# Patient Record
Sex: Female | Born: 1980 | Race: White | Hispanic: No | Smoking: Never smoker
Health system: Southern US, Community
[De-identification: ages and names within clinical notes are randomized; demographics above are authoritative.]

## PROBLEM LIST (undated history)

## (undated) ENCOUNTER — Emergency Department (HOSPITAL_COMMUNITY): Admission: EM | Payer: 59 | Source: Home / Self Care

## (undated) DIAGNOSIS — Z8742 Personal history of other diseases of the female genital tract: Secondary | ICD-10-CM

## (undated) DIAGNOSIS — F32A Depression, unspecified: Secondary | ICD-10-CM

## (undated) DIAGNOSIS — F329 Major depressive disorder, single episode, unspecified: Secondary | ICD-10-CM

## (undated) DIAGNOSIS — G43909 Migraine, unspecified, not intractable, without status migrainosus: Secondary | ICD-10-CM

## (undated) DIAGNOSIS — Z62810 Personal history of physical and sexual abuse in childhood: Secondary | ICD-10-CM

## (undated) DIAGNOSIS — M1990 Unspecified osteoarthritis, unspecified site: Secondary | ICD-10-CM

## (undated) DIAGNOSIS — E559 Vitamin D deficiency, unspecified: Secondary | ICD-10-CM

## (undated) DIAGNOSIS — R011 Cardiac murmur, unspecified: Secondary | ICD-10-CM

## (undated) HISTORY — DX: Vitamin D deficiency, unspecified: E55.9

## (undated) HISTORY — DX: Cardiac murmur, unspecified: R01.1

## (undated) HISTORY — PX: DILATATION & CURETTAGE/HYSTEROSCOPY WITH TRUECLEAR: SHX6353

## (undated) HISTORY — DX: Migraine, unspecified, not intractable, without status migrainosus: G43.909

## (undated) HISTORY — DX: Personal history of other diseases of the female genital tract: Z87.42

## (undated) HISTORY — PX: ABDOMINAL HYSTERECTOMY: SHX81

## (undated) HISTORY — DX: Personal history of physical and sexual abuse in childhood: Z62.810

---
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
## 2013-04-20 ENCOUNTER — Encounter: Payer: Self-pay | Admitting: Emergency Medicine

## 2013-04-20 ENCOUNTER — Emergency Department (INDEPENDENT_AMBULATORY_CARE_PROVIDER_SITE_OTHER)
Admission: EM | Admit: 2013-04-20 | Discharge: 2013-04-20 | Disposition: A | Discharge status: Home / Self Care | Payer: 59 | Source: Home / Self Care | Attending: Emergency Medicine | Admitting: Emergency Medicine

## 2013-04-20 DIAGNOSIS — J0100 Acute maxillary sinusitis, unspecified: Secondary | ICD-10-CM

## 2013-04-20 HISTORY — DX: Depression, unspecified: F32.A

## 2013-04-20 HISTORY — DX: Unspecified osteoarthritis, unspecified site: M19.90

## 2013-04-20 HISTORY — DX: Major depressive disorder, single episode, unspecified: F32.9

## 2013-04-20 MED ORDER — PROMETHAZINE-CODEINE 6.25-10 MG/5ML PO SYRP: ORAL_SOLUTION | ORAL | Status: DC

## 2013-04-20 MED ORDER — LEVOFLOXACIN 500 MG PO TABS: ORAL_TABLET | ORAL | Status: DC

## 2013-04-20 MED ORDER — PSEUDOEPHEDRINE-GUAIFENESIN ER 120-1200 MG PO TB12: 1.0000 | ORAL_TABLET | Freq: Two times a day (BID) | ORAL | Status: DC

## 2013-04-20 NOTE — ED Provider Notes (Signed)
CSN: [id number]     Arrival date & time 04/20/13  4098 History   First MD Initiated Contact with Patient 04/20/13 1008     Chief Complaint  Patient presents with  . Cough  . Nasal Congestion   (Consider location/radiation/quality/duration/timing/severity/associated sxs/prior Treatment) HPI URI HISTORY  Gwendolyn Bowen is a 32 y.o. female who complains of onset of cold symptoms for 7 days.  Have been using over-the-counter treatment which helps a little bit.--Symptoms much worse for 2 days  No chills/sweats +  Fever  +  Nasal congestion +  Discolored, yellowish-green Post-nasal drainage Positive sinus pain/pressure No sore throat  +  Cough, usually nonproductive. Keeps her awake at night No wheezing No chest congestion No hemoptysis No shortness of breath No pleuritic pain  No itchy/red eyes No earache  No nausea No vomiting No abdominal pain No diarrhea  No skin rashes +  Fatigue No myalgias Mild headache  Past Medical History  Diagnosis Date  . Depression   . Arthritis    Past Surgical History  Procedure Laterality Date  . Dilatation & curettage/hysteroscopy with trueclear     Family History  Problem Relation Age of Onset  . Hypertension Mother   . Rheum arthritis Mother   . Cancer Mother     breast  . Clotting disorder Mother   . Bell's palsy Mother    History  Substance Use Topics  . Smoking status: Never Smoker   . Smokeless tobacco: Not on file  . Alcohol Use: No   OB History   Grav Para Term Preterm Abortions TAB SAB Ect Mult Living                 Review of Systems  All other systems reviewed and are negative.    Allergies  Amoxicillin; Asa; Ibuprofen; Penicillins; and Sulfa antibiotics  Home Medications   Current Outpatient Rx  Name  Route  Sig  Dispense  Refill  . clonazePAM (KLONOPIN) 0.5 MG tablet   Oral   Take 0.5 mg by mouth 2 (two) times daily as needed for anxiety.         . sertraline (ZOLOFT) 100 MG tablet   Oral   Take  100 mg by mouth daily.         Marland Kitchen levofloxacin (LEVAQUIN) 500 MG tablet      Take 1 tablet daily for 10 days.   10 tablet   0   . promethazine-codeine (PHENERGAN WITH CODEINE) 6.25-10 MG/5ML syrup      Take 1-2 teaspoons every 4-6 hours as needed for cough. May cause drowsiness.   120 mL   0   . Pseudoephedrine-Guaifenesin [phone] MG TB12   Oral   Take 1 tablet by mouth 2 (two) times daily. As needed for congestion. (Caution: Do not take if blood pressure is elevated)   20 each   0    BP 102/64  Pulse 75  Temp(Src) 98.1 F (36.7 C) (Oral)  Resp 18  Wt 115 lb (52.164 kg)  SpO2 99%  LMP 03/30/2013 Physical Exam  Nursing note and vitals reviewed. Constitutional: She is oriented to person, place, and time. She appears well-developed and well-nourished. No distress.  HENT:  Head: Normocephalic and atraumatic.  Right Ear: Tympanic membrane, external ear and ear canal normal.  Left Ear: Tympanic membrane, external ear and ear canal normal.  Nose: Mucosal edema and rhinorrhea present. Right sinus exhibits maxillary sinus tenderness. Left sinus exhibits maxillary sinus tenderness.  Mouth/Throat: Oropharynx is clear and  moist. No oral lesions. No oropharyngeal exudate.  Eyes: Right eye exhibits no discharge. Left eye exhibits no discharge. No scleral icterus.  Neck: Neck supple.  Cardiovascular: Normal rate, regular rhythm and normal heart sounds.   Pulmonary/Chest: Effort normal and breath sounds normal. She has no wheezes. She has no rales.  Lymphadenopathy:    She has no cervical adenopathy.  Neurological: She is alert and oriented to person, place, and time.  Skin: Skin is warm and dry.    ED Course  Procedures (including critical care time) Labs Review Labs Reviewed - No data to display Imaging Review No results found.  EKG Interpretation     Ventricular Rate:    PR Interval:    QRS Duration:   QT Interval:    QTC Calculation:   R Axis:     Text  Interpretation:              MDM   1. Acute maxillary sinusitis    Discussed antibiotic treatment options at length. She is allergic to amoxicillin penicillin and sulfa. She states that Zithromax has not worked well in the past. She denies chance of pregnancy, as she is status post BTL, LMP 3 weeks ago. Prescribed Levaquin 500 mg daily x10 days. Pseudoephedrine-Guaifenesin [phone] MG twice a day for congestion Promethazine with codeine for cough, precautions discussed Other symptomatic care. Followup with PCP if no better one week, sooner when necessary Precautions discussed. Red flags discussed. Questions invited and answered. Patient voiced understanding and agreement.      Lajean Manes, MD 04/20/13 [phone]

## 2013-04-20 NOTE — ED Notes (Signed)
Pt c/o cough, chest and nasal congestion, and sneezing x 1wk. Denies fever.

## 2013-05-04 ENCOUNTER — Telehealth: Payer: Self-pay

## 2013-05-04 NOTE — Telephone Encounter (Signed)
error 

## 2013-09-30 ENCOUNTER — Ambulatory Visit: Payer: 59 | Admitting: Family Medicine

## 2013-10-07 ENCOUNTER — Ambulatory Visit: Payer: 59 | Admitting: Family Medicine

## 2013-10-07 DIAGNOSIS — Z0289 Encounter for other administrative examinations: Secondary | ICD-10-CM

## 2013-11-15 ENCOUNTER — Ambulatory Visit: Payer: 59 | Admitting: Family Medicine

## 2013-11-15 DIAGNOSIS — Z0289 Encounter for other administrative examinations: Secondary | ICD-10-CM

## 2014-01-25 ENCOUNTER — Ambulatory Visit (INDEPENDENT_AMBULATORY_CARE_PROVIDER_SITE_OTHER): Payer: 59 | Admitting: Family Medicine

## 2014-01-25 ENCOUNTER — Encounter: Payer: Self-pay | Admitting: Family Medicine

## 2014-01-25 VITALS — BP 121/73 | HR 60 | Ht 64.0 in | Wt 113.0 lb

## 2014-01-25 DIAGNOSIS — L309 Dermatitis, unspecified: Secondary | ICD-10-CM | POA: Insufficient documentation

## 2014-01-25 DIAGNOSIS — L259 Unspecified contact dermatitis, unspecified cause: Secondary | ICD-10-CM

## 2014-01-25 DIAGNOSIS — B9689 Other specified bacterial agents as the cause of diseases classified elsewhere: Secondary | ICD-10-CM

## 2014-01-25 DIAGNOSIS — IMO0002 Reserved for concepts with insufficient information to code with codable children: Secondary | ICD-10-CM | POA: Insufficient documentation

## 2014-01-25 DIAGNOSIS — J329 Chronic sinusitis, unspecified: Secondary | ICD-10-CM

## 2014-01-25 DIAGNOSIS — F32A Depression, unspecified: Secondary | ICD-10-CM

## 2014-01-25 DIAGNOSIS — G43909 Migraine, unspecified, not intractable, without status migrainosus: Secondary | ICD-10-CM

## 2014-01-25 DIAGNOSIS — F329 Major depressive disorder, single episode, unspecified: Secondary | ICD-10-CM | POA: Insufficient documentation

## 2014-01-25 DIAGNOSIS — G43009 Migraine without aura, not intractable, without status migrainosus: Secondary | ICD-10-CM

## 2014-01-25 DIAGNOSIS — A499 Bacterial infection, unspecified: Secondary | ICD-10-CM

## 2014-01-25 DIAGNOSIS — F419 Anxiety disorder, unspecified: Principal | ICD-10-CM

## 2014-01-25 DIAGNOSIS — G43709 Chronic migraine without aura, not intractable, without status migrainosus: Secondary | ICD-10-CM | POA: Insufficient documentation

## 2014-01-25 DIAGNOSIS — F341 Dysthymic disorder: Secondary | ICD-10-CM

## 2014-01-25 MED ORDER — SUMATRIPTAN SUCCINATE 6 MG/0.5ML ~~LOC~~ SOTJ: SUBCUTANEOUS | Status: DC

## 2014-01-25 MED ORDER — SERTRALINE HCL 100 MG PO TABS: 100.0000 mg | ORAL_TABLET | Freq: Every day | ORAL | Status: DC

## 2014-01-25 MED ORDER — LEVOFLOXACIN 500 MG PO TABS: 500.0000 mg | ORAL_TABLET | Freq: Every day | ORAL | Status: DC

## 2014-01-25 NOTE — Progress Notes (Signed)
CC: Gwendolyn Bowen is a 33 y.o. female is here for Establish Care   Subjective: HPI:  Pleasant 33 year old her to establish care works at [employer]  Patient reports a history of anxiety and depression which has been present ever since she was 18. She began taking Zoloft approximately 8 years ago due to what was thought to be worsening depression due to postpartum depression. States that Zoloft helped dramatically with her outlook on life and help with dealing with stress.  She reports anxiety has also been present intermittently in her past however also improved with Zoloft and sparingly used clonazepam. She stopped both medications somewhere between 3-6 months ago to see if she still needed to be on them. She states her quality of life is satisfactory but her husband and coworkers have noticed increasing nervousness and pessimism since she stopped these medications. She denies any thoughts or not resolve or others recently or remotely. She is entertaining the idea of restarting Zoloft  Reports a history of migraines have been present for matter of years. He used to be responsive to nonsteroidal anti-inflammatories however she's had serious reactions to multiple nonsteroidal anti-inflammatory medications over the past years. Describes migraines as without aura, pounding, unilateral, photophobia without any other motor or sensory disturbances present prior during or after the migraine. Medications like Maxalt and Imitrex have not provided any benefit however Sumavel DosePro injections provided almost immediate relief of her migraines. Denies change in the character severity or frequency of her migraines over the past year  She reports a history of chronic skin irritation from light touch, shaving it is of moderate severity and only barely responds to Benadryl. No benefit from Allegra, Zyrtec, or Claritin. 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 She never has seen an allergist.  She complains of left-sided facial pressure localized beneath the cheek along with nasal congestion and a postnasal drip that has been present for the past week worsening on a daily basis. No benefit from Benadryl or other over-the-counter antihistamines. Symptoms are moderate in severity.  Review of Systems - General ROS: negative for - chills, fever, night sweats, weight gain or weight loss Ophthalmic ROS: negative for - decreased vision Psychological ROS: negative for - mental disturbance other than that described above ENT ROS: negative for - hearing change, tinnitus  Hematological and Lymphatic ROS: negative for - bleeding problems, bruising or swollen lymph nodes Breast ROS: negative Respiratory ROS: no cough, shortness of breath, or wheezing Cardiovascular ROS: no chest pain or dyspnea on exertion Gastrointestinal ROS: no abdominal pain, change in bowel habits, or black or bloody stools Genito-Urinary ROS: negative for - genital discharge, genital ulcers, incontinence or abnormal bleeding from genitals Musculoskeletal ROS: negative for - joint pain or muscle pain Neurological ROS: negative for - memory loss Dermatological ROS: negative for lumps, mole changes, rash and skin lesion changes other than that described above  Past Medical History  Diagnosis Date  . Depression   . Arthritis     Past Surgical History  Procedure Laterality Date  . Dilatation & curettage/hysteroscopy with trueclear     Family History  Problem Relation Age of Onset  . Hypertension Mother   . Rheum arthritis Mother   . Cancer Mother     breast  . Clotting disorder Mother   . Bell's palsy Mother     History   Social History  . Marital Status: Married    Spouse Name: N/A  Number of Children: N/A  . Years of Education: N/A   Occupational History  . Not on file.   Social History Main Topics  . Smoking status: Never Smoker   . Smokeless  tobacco: Not on file  . Alcohol Use: No  . Drug Use: No  . Sexual Activity: Yes   Other Topics Concern  . Not on file   Social History Narrative  . No narrative on file     Objective: BP 121/73  Pulse 60  Ht 5\' 4"  (1.626 m)  Wt 113 lb (51.256 kg)  BMI 19.39 kg/m2  General: Alert and Oriented, No Acute Distress HEENT: Pupils equal, round, reactive to light. Conjunctivae clear.  External ears unremarkable, canals clear with intact TMs with appropriate landmarks.  Middle ear appears open without effusion. Pink inferior turbinates.  Moist mucous membranes, pharynx without inflammation nor lesions However moderate cobblestoning.  Neck supple without palpable lymphadenopathy nor abnormal masses. Lungs: Clear to auscultation bilaterally, no wheezing/ronchi/rales.  Comfortable work of breathing. Good air movement. Cardiac: Regular rate and rhythm.  Neuro: Cranial nerves 2-12 grossly intact  Extremities: No peripheral edema.  Strong peripheral pulses.  Mental Status: No depression, anxiety, nor agitation. Skin: Warm and dry.  Assessment & Plan: Gwendolyn Bowen was seen today for establish care.  Diagnoses and associated orders for this visit:  Anxiety and depression - sertraline (ZOLOFT) 100 MG tablet; Take 1 tablet (100 mg total) by mouth daily.  Migraine without aura and without status migrainosus, not intractable - SUMAtriptan Succinate (SUMAVEL DOSEPRO) 6 MG/0.5ML SOTJ; Inject one dose in the thigh or abdomen at onset of migraine, may repeat once in an hour if needed.  Max 2 doses/24hours.  Bacterial sinusitis - levofloxacin (LEVAQUIN) 500 MG tablet; Take 1 tablet (500 mg total) by mouth daily.  Chronic dermatitis - Ambulatory referral to Allergy    Anxiety and depression: Uncontrolled restarting Zoloft, taking only half a tablet for the first 1-2 weeks then full tablet as tolerated Migraines: Controlled on sumatriptan injectable Chronic dermatitis: Referring to allergy to determine  her candidacy for allergy shots and further investigation into nonsteroidal anti-inflammatory allergy Actual sinusitis: Start levofloxacin discussed benefits of nasal saline washes   Return in about 3 months (around 04/27/2014) for Complete Physical Exam.

## 2014-01-31 ENCOUNTER — Telehealth: Payer: Self-pay | Admitting: Family Medicine

## 2014-01-31 NOTE — Telephone Encounter (Signed)
Re: Referral for Chronic Dermatitis Dr [facility] wants to know if you would like patient to be seen for Dermatitis or allergies because their doctor refuses to see patients for Dermatitis.  Thank you.

## 2014-01-31 NOTE — Telephone Encounter (Signed)
Referral was for both dermatitis and allergies, if they refuse to see dermatitis patients then can you please find another Allergy specialist that would be willing to help?

## 2014-03-01 ENCOUNTER — Telehealth: Payer: Self-pay

## 2014-03-01 NOTE — Telephone Encounter (Signed)
Gwendolyn Bowen called and reports diarrhea for 3 weeks and black diarrhea for 1 week. She has been taking Pepto Bismol which is the probable cause of the black diarrhea. Denies vomiting, chills, fevers or sweats. She has had some nausea. I tried to schedule her for the 4 pm appointment with Dr Ivan Anchors. She states she is unable to leave work today or even tomorrow. I advised her to go to the Urgent Care next door. She states she will go to the Urgent Care after hours.

## 2014-03-01 NOTE — Telephone Encounter (Signed)
Agreed -

## 2014-03-09 ENCOUNTER — Ambulatory Visit (INDEPENDENT_AMBULATORY_CARE_PROVIDER_SITE_OTHER): Payer: 59 | Admitting: Family Medicine

## 2014-03-09 ENCOUNTER — Encounter: Payer: Self-pay | Admitting: Family Medicine

## 2014-03-09 ENCOUNTER — Other Ambulatory Visit: Payer: Self-pay | Admitting: Family Medicine

## 2014-03-09 VITALS — BP 104/66 | HR 84 | Ht 64.0 in | Wt 115.0 lb

## 2014-03-09 DIAGNOSIS — R197 Diarrhea, unspecified: Secondary | ICD-10-CM

## 2014-03-09 DIAGNOSIS — G4700 Insomnia, unspecified: Secondary | ICD-10-CM

## 2014-03-09 LAB — COMPREHENSIVE METABOLIC PANEL
ALT: 19 U/L (ref 0–35)
AST: 16 U/L (ref 0–37)
Albumin: 4.1 g/dL (ref 3.5–5.2)
Alkaline Phosphatase: 39 U/L (ref 39–117)
BUN: 13 mg/dL (ref 6–23)
CO2: 26 mEq/L (ref 19–32)
CREATININE: 0.71 mg/dL (ref 0.50–1.10)
Calcium: 8.7 mg/dL (ref 8.4–10.5)
Chloride: 105 mEq/L (ref 96–112)
Glucose, Bld: 52 mg/dL — ABNORMAL LOW (ref 70–99)
POTASSIUM: 4.1 meq/L (ref 3.5–5.3)
Sodium: 140 mEq/L (ref 135–145)
Total Bilirubin: 0.6 mg/dL (ref 0.2–1.2)
Total Protein: 6.3 g/dL (ref 6.0–8.3)

## 2014-03-09 LAB — CBC WITH DIFFERENTIAL/PLATELET
BASOS ABS: 0 10*3/uL (ref 0.0–0.1)
Basophils Relative: 0 % (ref 0–1)
Eosinophils Absolute: 0 10*3/uL (ref 0.0–0.7)
Eosinophils Relative: 0 % (ref 0–5)
HEMATOCRIT: 37.8 % (ref 36.0–46.0)
Hemoglobin: 12.8 g/dL (ref 12.0–15.0)
LYMPHS PCT: 29 % (ref 12–46)
Lymphs Abs: 1.5 10*3/uL (ref 0.7–4.0)
MCH: 28.9 pg (ref 26.0–34.0)
MCHC: 33.9 g/dL (ref 30.0–36.0)
MCV: 85.3 fL (ref 78.0–100.0)
Monocytes Absolute: 0.4 10*3/uL (ref 0.1–1.0)
Monocytes Relative: 7 % (ref 3–12)
NEUTROS ABS: 3.4 10*3/uL (ref 1.7–7.7)
Neutrophils Relative %: 64 % (ref 43–77)
PLATELETS: 179 10*3/uL (ref 150–400)
RBC: 4.43 MIL/uL (ref 3.87–5.11)
RDW: 13.9 % (ref 11.5–15.5)
WBC: 5.3 10*3/uL (ref 4.0–10.5)

## 2014-03-09 MED ORDER — ZOLPIDEM TARTRATE 10 MG PO TABS: 5.0000 mg | ORAL_TABLET | Freq: Every evening | ORAL | Status: DC | PRN

## 2014-03-09 NOTE — Progress Notes (Signed)
CC: Gwendolyn Bowen is a 33 y.o. female is here for Constipation   Subjective: HPI:  Complains of loose bowel movements that occur 2-5 times a day for the past month and a half. They described as liquid and loose, they will come on without warning she has had multiple accidents and she has indicated her sleep unknowingly. Symptoms are moderate to severe in severity slightly improved with Pepto-Bismol but not resolved. Nothing else seems to make better or worse. No linked to dietary habits. Accompanied with mild bloating but no abdominal or flank pain. Denies nausea, vomiting, fevers, chills, nor genitourinary complaints. Denies blood in stool nor melena. 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 Interventions have included trying to get a bed at different times of the night but no benefit. She denies anxiety or depression contributing to these symptoms. She denies pain contributing to these symptoms. She's never taken anything to help with this in the past. Denies any mental disturbance.   Review Of Systems Outlined In HPI  Past Medical History  Diagnosis Date  . Depression   . Arthritis     Past Surgical History  Procedure Laterality Date  . Dilatation & curettage/hysteroscopy with trueclear     Family History  Problem Relation Age of Onset  . Hypertension Mother   . Rheum arthritis Mother   . Cancer Mother     breast  . Clotting disorder Mother   . Bell's palsy Mother     History   Social History  . Marital Status: Married    Spouse Name: N/A    Number of Children: N/A  . Years of Education: N/A   Occupational History  . Not on file.   Social History Main Topics  . Smoking status: Never Smoker   . Smokeless tobacco: Not on file  . Alcohol Use: No  . Drug Use: No  . Sexual  Activity: Yes   Other Topics Concern  . Not on file   Social History Narrative  . No narrative on file     Objective: BP 104/66  Pulse 84  Ht 5' 4" (1.626 m)  Wt 115 lb (52.164 kg)  BMI 19.73 kg/m2  General: Alert and Oriented, No Acute Distress HEENT: Pupils equal, round, reactive to light. Conjunctivae clear.  Moist membranes Lungs: Clear to auscultation bilaterally, no wheezing/ronchi/rales.  Comfortable work of breathing. Good air movement. Cardiac: Regular rate and rhythm. Normal S1/S2.  No murmurs, rubs, nor gallops.   Abdomen: Flat and soft no abnormal bowel sounds Extremities: No peripheral edema.  Strong peripheral pulses.  Mental Status: No depression, anxiety, nor agitation. Skin: Warm and dry.  Assessment & Plan: Shakeria was seen today for constipation.  Diagnoses and associated orders for this visit:  Diarrhea - CBC w/Diff - Clostridium difficile EIA - Comp Met (CMET)  Insomnia - zolpidem (AMBIEN) 10 MG tablet; Take 0.5-1 tablets (5-10 mg total) by mouth at bedtime as needed for sleep.    Diarrhea: Concerning for possible C. Difficile, checking C. difficile and white count. Checking metabolic panel. Avoid antidiarrheal medication pending the above results. Insomnia: Trial of Ambien however I've asked her to wait to start this until we get her diarrhea under control.  Return if symptoms worsen or fail to improve.

## 2014-03-14 ENCOUNTER — Encounter: Payer: Self-pay | Admitting: *Deleted

## 2014-03-15 ENCOUNTER — Telehealth: Payer: Self-pay | Admitting: Family Medicine

## 2014-03-15 LAB — C. DIFFICILE GDH AND TOXIN A/B
C. difficile GDH: NOT DETECTED
C. difficile Toxin A/B: NOT DETECTED

## 2014-03-15 MED ORDER — DIPHENOXYLATE-ATROPINE 2.5-0.025 MG PO TABS: 1.0000 | ORAL_TABLET | Freq: Three times a day (TID) | ORAL | Status: DC | PRN

## 2014-03-15 NOTE — Telephone Encounter (Signed)
Sue Lush, Will you please let patient know that her c. Diff test was negative showing no sign of infection.  To control her diarrhea I'd recommend taking 1-2 heaping teaspoons of psyllium powder daily that can be obtained OTC.  Additionally I'll print out an Rx of lomotil which can be used PRN for any residual diarrhea.

## 2014-03-15 NOTE — Telephone Encounter (Signed)
Left message on vm to return call and I also faxed script to pharmacy. This was also left on vm. Waiting on return call. Corliss Skains, CMA

## 2014-03-15 NOTE — Telephone Encounter (Signed)
Patient advised of results and recommendations.  

## 2014-05-12 ENCOUNTER — Other Ambulatory Visit: Payer: Self-pay | Admitting: Family Medicine

## 2014-05-25 ENCOUNTER — Other Ambulatory Visit: Payer: Self-pay | Admitting: Occupational Medicine

## 2014-05-25 ENCOUNTER — Ambulatory Visit: Payer: Self-pay

## 2014-05-25 DIAGNOSIS — R52 Pain, unspecified: Secondary | ICD-10-CM

## 2014-06-19 ENCOUNTER — Ambulatory Visit (INDEPENDENT_AMBULATORY_CARE_PROVIDER_SITE_OTHER): Payer: 59 | Admitting: Family Medicine

## 2014-06-19 ENCOUNTER — Encounter: Payer: Self-pay | Admitting: Family Medicine

## 2014-06-19 ENCOUNTER — Telehealth: Payer: Self-pay

## 2014-06-19 VITALS — BP 100/61 | HR 63 | Temp 98.6°F | Wt 120.0 lb

## 2014-06-19 DIAGNOSIS — R519 Headache, unspecified: Secondary | ICD-10-CM

## 2014-06-19 DIAGNOSIS — A499 Bacterial infection, unspecified: Secondary | ICD-10-CM

## 2014-06-19 DIAGNOSIS — B9689 Other specified bacterial agents as the cause of diseases classified elsewhere: Secondary | ICD-10-CM

## 2014-06-19 DIAGNOSIS — G4700 Insomnia, unspecified: Secondary | ICD-10-CM

## 2014-06-19 DIAGNOSIS — R51 Headache: Secondary | ICD-10-CM

## 2014-06-19 DIAGNOSIS — R110 Nausea: Secondary | ICD-10-CM

## 2014-06-19 DIAGNOSIS — J329 Chronic sinusitis, unspecified: Secondary | ICD-10-CM

## 2014-06-19 MED ORDER — ZOLPIDEM TARTRATE ER 12.5 MG PO TBCR: 12.5000 mg | EXTENDED_RELEASE_TABLET | Freq: Every evening | ORAL | Status: DC | PRN

## 2014-06-19 MED ORDER — DOXYCYCLINE HYCLATE 100 MG PO TABS
ORAL_TABLET | ORAL | Status: AC
Start: 2014-06-19 — End: 2014-06-29

## 2014-06-19 NOTE — Progress Notes (Signed)
CC: Gwendolyn Bowen is a 33 y.o. female is here for Medication Management and Cough   Subjective: HPI:  Cough with sore throat that has been present for 1 week worsening on a daily basis now moderate in severity. Symptoms seem to be worse when reclining back. Seem to be worse at nighttime. Interventions have included cough drops without much benefit. No other interventions as of yet. Accompanied by fatigue and night sweats. Denies fevers, chills, wheezing, shortness of breath, chest pain, , nor nasal congestion.  Complaints of chronic nausea that has been present for matter of years. Symptoms are mild to moderate in severity but she is now wondering if this needs further workup or intervention. She denies vomiting, abdominal pain, unintentional weight loss or gain. Nothing particular seems to make the nausea better or worse.  Follow-up insomnia: She's been taking a full dose of Ambien on a nightly basis. Over the past month most nights out of the week she wakes up around 1 or 2 in the morning for no particular reason. She has difficulty falling back asleep. She denies any difficulty with falling asleep initially when she goes to bed provided she takes Ambien. Denies any new mental disturbance, denies pain awakening her from her sleep  She would like to know if I can fill out FMLA paperwork for her headaches. She's had to call out on average 1-2 times a year due to her severe migraines. She denies any change in the character or severity or frequency of these headaches. Other than headaches denies any motor or sensory disturbances   Review Of Systems Outlined In HPI  Past Medical History  Diagnosis Date  . Depression   . Arthritis     Past Surgical History  Procedure Laterality Date  . Dilatation & curettage/hysteroscopy with trueclear     Family History  Problem Relation Age of Onset  . Hypertension Mother   . Rheum arthritis Mother   . Cancer Mother     breast  . Clotting disorder Mother    . Bell's palsy Mother     History   Social History  . Marital Status: Married    Spouse Name: N/A    Number of Children: N/A  . Years of Education: N/A   Occupational History  . Not on file.   Social History Main Topics  . Smoking status: Never Smoker   . Smokeless tobacco: Not on file  . Alcohol Use: No  . Drug Use: No  . Sexual Activity: Yes   Other Topics Concern  . Not on file   Social History Narrative     Objective: BP 100/61 mmHg  Pulse 63  Temp(Src) 98.6 F (37 C) (Oral)  Wt 120 lb (54.432 kg)  LMP 04/27/2014  General: Alert and Oriented, No Acute Distress HEENT: Pupils equal, round, reactive to light. Conjunctivae clear.  External ears unremarkable, canals clear with intact TMs with appropriate landmarks.  Middle ear appears open without effusion. Pink inferior turbinates.  Moist mucous membranes, pharynx without inflammation nor lesions.  Neck supple without palpable lymphadenopathy nor abnormal masses. Lungs: Clear to auscultation bilaterally, no wheezing/ronchi/rales.  Comfortable work of breathing. Good air movement. Extremities: No peripheral edema.  Strong peripheral pulses.  Mental Status: No depression, anxiety, nor agitation. Requires frequent redirection Skin: Warm and dry.  Assessment & Plan: Gwendolyn Bowen was seen today for medication management and cough.  Diagnoses and associated orders for this visit:  Nausea without vomiting - Lipase - Comp Met (CMET)  Bacterial sinusitis -  doxycycline (VIBRA-TABS) 100 MG tablet; One by mouth twice a day for ten days.  Insomnia - zolpidem (AMBIEN CR) 12.5 MG CR tablet; Take 1 tablet (12.5 mg total) by mouth at bedtime as needed for sleep.  Chronic nonintractable headache, unspecified headache type    Nausea: Checking lipase and metabolic panel, ultimate recommendations will be determined based on these results Bacterial sinusitis with postnasal drip causing cough, start doxycycline consider nasal saline  washes Insomnia: Uncontrolled stop immediate release Ambien and switching to long-acting formulation Headaches: Controlled, She does not have FMLA paperwork with her today but I told her I would be happy to help fill this out if she drops off in the future.  40 minutes spent face-to-face during visit today of which at least 50% was counseling or coordinating care regarding: 1. Nausea without vomiting   2. Bacterial sinusitis   3. Insomnia   4. Chronic nonintractable headache, unspecified headache type      Return if symptoms worsen or fail to improve.

## 2014-06-19 NOTE — Telephone Encounter (Signed)
[name] would like medication for her nausea. She would prefer Zofran. Please advise.

## 2014-06-20 MED ORDER — ONDANSETRON HCL 4 MG PO TABS: 4.0000 mg | ORAL_TABLET | Freq: Three times a day (TID) | ORAL | Status: DC | PRN

## 2014-06-20 NOTE — Telephone Encounter (Signed)
Rx sent to [facility].  

## 2014-06-21 LAB — COMPREHENSIVE METABOLIC PANEL
ALK PHOS: 41 U/L (ref 39–117)
ALT: 14 U/L (ref 0–35)
AST: 15 U/L (ref 0–37)
Albumin: 4.3 g/dL (ref 3.5–5.2)
BILIRUBIN TOTAL: 0.6 mg/dL (ref 0.2–1.2)
BUN: 12 mg/dL (ref 6–23)
CO2: 32 mEq/L (ref 19–32)
CREATININE: 0.94 mg/dL (ref 0.50–1.10)
Calcium: 9.3 mg/dL (ref 8.4–10.5)
Chloride: 103 mEq/L (ref 96–112)
Glucose, Bld: 116 mg/dL — ABNORMAL HIGH (ref 70–99)
Potassium: 3.7 mEq/L (ref 3.5–5.3)
Sodium: 141 mEq/L (ref 135–145)
TOTAL PROTEIN: 6.7 g/dL (ref 6.0–8.3)

## 2014-06-21 LAB — LIPASE: Lipase: 16 U/L (ref 0–75)

## 2014-07-18 ENCOUNTER — Ambulatory Visit (INDEPENDENT_AMBULATORY_CARE_PROVIDER_SITE_OTHER): Payer: 59 | Admitting: Family Medicine

## 2014-07-18 ENCOUNTER — Encounter: Payer: Self-pay | Admitting: Family Medicine

## 2014-07-18 VITALS — BP 105/67 | HR 78 | Wt 120.0 lb

## 2014-07-18 DIAGNOSIS — J329 Chronic sinusitis, unspecified: Secondary | ICD-10-CM

## 2014-07-18 DIAGNOSIS — B9689 Other specified bacterial agents as the cause of diseases classified elsewhere: Secondary | ICD-10-CM

## 2014-07-18 DIAGNOSIS — A499 Bacterial infection, unspecified: Secondary | ICD-10-CM

## 2014-07-18 MED ORDER — LEVOFLOXACIN 500 MG PO TABS: 500.0000 mg | ORAL_TABLET | Freq: Every day | ORAL | Status: DC

## 2014-07-18 NOTE — Progress Notes (Signed)
CC: Gwendolyn Bowen is a 34 y.o. female is here for Sinusitis   Subjective: HPI:  Facial pressure localized to the forehead with nasal congestion and nonproductive cough. Accompanied by chills and subjective fever. This is been present for a little over a week now worsening over the past 2 days. Overall moderate in severity. No benefit from Mucinex or over-the-counter cough and cold medication. Nothing seems to make symptoms better or worse that she is aware of. They're present all hours of the day not interfering with sleep. Denies shortness of breath, cough, dysphagia, confusion, nor motor or sensory disturbances   Review Of Systems Outlined In HPI  Past Medical History  Diagnosis Date  . Depression   . Arthritis     Past Surgical History  Procedure Laterality Date  . Dilatation & curettage/hysteroscopy with trueclear     Family History  Problem Relation Age of Onset  . Hypertension Mother   . Rheum arthritis Mother   . Cancer Mother     breast  . Clotting disorder Mother   . Bell's palsy Mother     History   Social History  . Marital Status: Married    Spouse Name: N/A    Number of Children: N/A  . Years of Education: N/A   Occupational History  . Not on file.   Social History Main Topics  . Smoking status: Never Smoker   . Smokeless tobacco: Not on file  . Alcohol Use: No  . Drug Use: No  . Sexual Activity: Yes   Other Topics Concern  . Not on file   Social History Narrative     Objective: BP 105/67 mmHg  Pulse 78  Wt 120 lb (54.432 kg)  General: Alert and Oriented, No Acute Distress HEENT: Pupils equal, round, reactive to light. Conjunctivae clear.  External ears unremarkable, canals clear with intact TMs with appropriate landmarks.  Middle ear appears open without effusion. Pink inferior turbinates.  Moist mucous membranes, pharynx without inflammation other than postnasal drip and mild cobblestoning.  Neck supple without palpable lymphadenopathy nor  abnormal masses. Lungs: Clear to auscultation bilaterally, no wheezing/ronchi/rales.  Comfortable work of breathing. Good air movement.  Mental Status: No depression, anxiety, nor agitation. Skin: Warm and dry.  Assessment & Plan: Gwendolyn Bowen was seen today for sinusitis.  Diagnoses and associated orders for this visit:  Bacterial sinusitis - levofloxacin (LEVAQUIN) 500 MG tablet; Take 1 tablet (500 mg total) by mouth daily.    Bacterial sinusitis: Allergic to penicillins, was on doxycycline within the last month, will start levofloxacin. Encouraged to start nasal saline washes.  Return if symptoms worsen or fail to improve.

## 2014-10-19 ENCOUNTER — Encounter: Payer: Self-pay | Admitting: Family Medicine

## 2014-10-19 ENCOUNTER — Ambulatory Visit (INDEPENDENT_AMBULATORY_CARE_PROVIDER_SITE_OTHER): Payer: 59 | Admitting: Family Medicine

## 2014-10-19 VITALS — BP 99/64 | HR 71 | Wt 118.0 lb

## 2014-10-19 DIAGNOSIS — F329 Major depressive disorder, single episode, unspecified: Secondary | ICD-10-CM

## 2014-10-19 DIAGNOSIS — F418 Other specified anxiety disorders: Secondary | ICD-10-CM | POA: Diagnosis not present

## 2014-10-19 DIAGNOSIS — G4700 Insomnia, unspecified: Secondary | ICD-10-CM | POA: Diagnosis not present

## 2014-10-19 DIAGNOSIS — F419 Anxiety disorder, unspecified: Principal | ICD-10-CM

## 2014-10-19 DIAGNOSIS — F32A Depression, unspecified: Secondary | ICD-10-CM

## 2014-10-19 MED ORDER — ZOLPIDEM TARTRATE ER 12.5 MG PO TBCR: 12.5000 mg | EXTENDED_RELEASE_TABLET | Freq: Every evening | ORAL | Status: DC | PRN

## 2014-10-19 MED ORDER — VENLAFAXINE HCL ER 75 MG PO CP24: ORAL_CAPSULE | ORAL | Status: DC

## 2014-10-19 NOTE — Progress Notes (Signed)
CC: Gwendolyn Bowen is a 34 y.o. female is here for feels celexa is not working for her   Subjective: HPI:  Follow-up anxiety and depression: She tells me she doesn't particularly feel depressed lately but her anxiety and irritability seems to be worsening for reasons unknown to her. She describes this as a short tolerance and irritability towards annoying stimuli, job responsibilities, and family responsibilities. Symptoms are moderate in severity present on a daily basis and at work and at home. It seems to make the symptoms better. Zoloft used to work fantastic for this however lost its effectiveness and when switched to Celexa she feels like it never really helped. She's been waiting months now for it to provide some degree of relief. She tried to double the dose however caused her to feel sedated to a degree that she could not tolerate. Denies thoughts when to harm herself or others. No sleep disturbance ever since she started taking as needed Ambien. She's requesting refills on Ambien does help with both falling asleep and staying asleep without any sedation the next morning. No known side effects to either medication other than that above   Review Of Systems Outlined In HPI  Past Medical History  Diagnosis Date  . Depression   . Arthritis     Past Surgical History  Procedure Laterality Date  . Dilatation & curettage/hysteroscopy with trueclear     Family History  Problem Relation Age of Onset  . Hypertension Mother   . Rheum arthritis Mother   . Cancer Mother     breast  . Clotting disorder Mother   . Bell's palsy Mother     History   Social History  . Marital Status: Married    Spouse Name: N/A  . Number of Children: N/A  . Years of Education: N/A   Occupational History  . Not on file.   Social History Main Topics  . Smoking status: Never Smoker   . Smokeless tobacco: Not on file  . Alcohol Use: No  . Drug Use: No  . Sexual Activity: Yes   Other Topics Concern   . Not on file   Social History Narrative     Objective: BP 99/64 mmHg  Pulse 71  Wt 118 lb (53.524 kg)  Vital signs reviewed. General: Alert and Oriented, No Acute Distress HEENT: Pupils equal, round, reactive to light. Conjunctivae clear.  External ears unremarkable.  Moist mucous membranes. Lungs: Clear and comfortable work of breathing, speaking in full sentences without accessory muscle use. Cardiac: Regular rate and rhythm.  Neuro: CN II-XII grossly intact, gait normal. Extremities: No peripheral edema.  Strong peripheral pulses.  Mental Status: No depression, anxiety, nor agitation. Logical though process. Skin: Warm and dry.  Assessment & Plan: Gwendolyn RoundsSally was seen today for feels celexa is not working for her.  Diagnoses and all orders for this visit:  Anxiety and depression Orders: -     venlafaxine XR (EFFEXOR XR) 75 MG 24 hr capsule; One by mouth daily for one week then two by mouth every day if tolerated.  Insomnia Orders: -     zolpidem (AMBIEN CR) 12.5 MG CR tablet; Take 1 tablet (12.5 mg total) by mouth at bedtime as needed for sleep.   Anxiety and depression: Depression seems controlled however anxiety has reached a point where it's significantly interfere with quality of life therefore start Effexor after stopping Celexa tomorrow. Insomnia: Control with as needed Ambien  25 minutes spent face-to-face during visit today of which  at least 50% was counseling or coordinating care regarding: 1. Anxiety and depression   2. Insomnia      Return in about 3 months (around 01/18/2015).

## 2014-10-26 ENCOUNTER — Telehealth: Payer: Self-pay | Admitting: Family Medicine

## 2014-10-26 NOTE — Telephone Encounter (Signed)
Sue Lushndrea, Will you please ask patient which condition/diagnosis she would like me to submit her FMLA under and any recent missed days due to this condition?

## 2014-10-26 NOTE — Telephone Encounter (Signed)
Called pt. Left message on vm Gwendolyn Bowen Gwendolyn Bowen SMA

## 2014-10-31 NOTE — Telephone Encounter (Signed)
She reports the diagnosis is Migraines.

## 2015-01-05 ENCOUNTER — Encounter: Payer: Self-pay | Admitting: Family Medicine

## 2015-01-05 ENCOUNTER — Ambulatory Visit (INDEPENDENT_AMBULATORY_CARE_PROVIDER_SITE_OTHER): Payer: 59 | Admitting: Family Medicine

## 2015-01-05 VITALS — BP 104/66 | HR 97 | Temp 98.6°F | Wt 124.0 lb

## 2015-01-05 DIAGNOSIS — H9203 Otalgia, bilateral: Secondary | ICD-10-CM

## 2015-01-05 DIAGNOSIS — F419 Anxiety disorder, unspecified: Principal | ICD-10-CM

## 2015-01-05 DIAGNOSIS — F418 Other specified anxiety disorders: Secondary | ICD-10-CM | POA: Diagnosis not present

## 2015-01-05 DIAGNOSIS — G43009 Migraine without aura, not intractable, without status migrainosus: Secondary | ICD-10-CM

## 2015-01-05 DIAGNOSIS — F329 Major depressive disorder, single episode, unspecified: Secondary | ICD-10-CM

## 2015-01-05 DIAGNOSIS — M722 Plantar fascial fibromatosis: Secondary | ICD-10-CM

## 2015-01-05 MED ORDER — DOXYCYCLINE HYCLATE 100 MG PO TABS: ORAL_TABLET | ORAL | Status: AC

## 2015-01-05 MED ORDER — SUMATRIPTAN SUCCINATE 6 MG/0.5ML ~~LOC~~ SOTJ
SUBCUTANEOUS | Status: DC
Start: 2015-01-05 — End: 2015-03-08

## 2015-01-05 MED ORDER — VENLAFAXINE HCL ER 75 MG PO CP24: ORAL_CAPSULE | ORAL | Status: DC

## 2015-01-05 NOTE — Progress Notes (Signed)
CC: Gwendolyn Bowen is a 34 y.o. female is here for Ear Pain and f/u migraines   Subjective: HPI:   Follow-up anxiety and depression: She tells me that Effexor is working great for both anxiety and depression. She denies any element of either in her life at this time.  Complains of migraines that have been occurring on a daily basis for the last week. Severity frequency and character has not changed since starting Effexor. Usually sumatriptan helps however she's run out of this medication. She's tried ibuprofen to help with this but doesn't seem to be working. Symptoms are currently mild in severity  Complains of bilateral ear pain described as a fullness and clicking sensation without any hearing loss. She's had some pressure in the forehead and cheeks but no nasal congestion or sore throat. No fevers, chills nor any other motor or sensory disturbances. No interventions as of yet and symptoms have been present for a week  Complains of a sharp stretching sensation that is moderate in severity only present at rest when not weightbearing that is localized on the plantar surface of both feet. Seems to be running from the heel towards the toes. It's absent when she is up walking around. His present for a few months and seems to be slowly getting worse. No intervention as of yet   Review Of Systems Outlined In HPI  Past Medical History  Diagnosis Date  . Depression   . Arthritis     Past Surgical History  Procedure Laterality Date  . Dilatation & curettage/hysteroscopy with trueclear     Family History  Problem Relation Age of Onset  . Hypertension Mother   . Rheum arthritis Mother   . Cancer Mother     breast  . Clotting disorder Mother   . Bell's palsy Mother     History   Social History  . Marital Status: Married    Spouse Name: N/A  . Number of Children: N/A  . Years of Education: N/A   Occupational History  . Not on file.   Social History Main Topics  . Smoking status:  Never Smoker   . Smokeless tobacco: Not on file  . Alcohol Use: No  . Drug Use: No  . Sexual Activity: Yes   Other Topics Concern  . Not on file   Social History Narrative     Objective: BP 104/66 mmHg  Pulse 97  Temp(Src) 98.6 F (37 C) (Oral)  Wt 124 lb (56.246 kg)  General: Alert and Oriented, No Acute Distress HEENT: Pupils equal, round, reactive to light. Conjunctivae clear.  External ears unremarkable, canals clear with intact TMs with appropriate landmarks.  Middle ear appears open without effusion. Pink inferior turbinates.  Moist mucous membranes, pharynx without inflammation nor lesions.  Neck supple without palpable lymphadenopathy nor abnormal masses. Lungs: Clear to auscultation bilaterally, no wheezing/ronchi/rales.  Comfortable work of breathing. Good air movement. Cardiac: Regular rate and rhythm. Normal S1/S2.  No murmurs, rubs, nor gallops.   Neuro: Cranial nerves II through XII grossly intact Extremities: No peripheral edema.  Strong peripheral pulses.  Mental Status: No depression, anxiety, nor agitation. Skin: Warm and dry.  Assessment & Plan: Gwendolyn RoundsSally was seen today for ear pain and f/u migraines.  Diagnoses and all orders for this visit:  Anxiety and depression Orders: -     venlafaxine XR (EFFEXOR XR) 75 MG 24 hr capsule; Two by mouth every day if tolerated.  Migraine without aura and without status migrainosus, not intractable  Orders: -     SUMAtriptan Succinate (SUMAVEL DOSEPRO) 6 MG/0.5ML SOTJ; Inject one dose in the thigh or abdomen at onset of migraine, may repeat once in an hour if needed.  Max 2 doses/24hours.  Plantar fasciitis  Ear pain, bilateral Orders: -     doxycycline (VIBRA-TABS) 100 MG tablet; One by mouth twice a day for ten days.   Anxiety depression: Controlled on Effexor Migraines: Uncontrolled only because she is out of sumatriptan, refills were provided, if she still having migraines on Monday please call me and we'll likely  start Topamax Plantar fasciitis: Start home rehabilitation exercises that she was provided with today, advised that she'll need to do this on a daily basis for at least 2-4 weeks before seeing improvement Ear pain felt to be secondary to sinusitis and eustachian tube dysfunction therefore start doxycycline, she cannot tolerate prednisone.   Return if symptoms worsen or fail to improve.

## 2015-01-18 ENCOUNTER — Other Ambulatory Visit: Payer: Self-pay | Admitting: Family Medicine

## 2015-02-26 ENCOUNTER — Emergency Department (HOSPITAL_COMMUNITY)
Admission: EM | Admit: 2015-02-26 | Discharge: 2015-02-26 | Disposition: A | Discharge status: Home / Self Care | Payer: 59 | Source: Home / Self Care | Attending: Family Medicine | Admitting: Family Medicine

## 2015-02-26 ENCOUNTER — Encounter (HOSPITAL_COMMUNITY): Payer: Self-pay | Admitting: Emergency Medicine

## 2015-02-26 DIAGNOSIS — J300 Vasomotor rhinitis: Secondary | ICD-10-CM | POA: Diagnosis not present

## 2015-02-26 MED ORDER — METHYLPREDNISOLONE ACETATE 40 MG/ML IJ SUSP
80.0000 mg | Freq: Once | INTRAMUSCULAR | Status: AC
  Administered 2015-02-26: 80 mg via INTRAMUSCULAR

## 2015-02-26 MED ORDER — IPRATROPIUM BROMIDE 0.06 % NA SOLN: 2.0000 | Freq: Four times a day (QID) | NASAL | Status: DC

## 2015-02-26 MED ORDER — TRIAMCINOLONE ACETONIDE 40 MG/ML IJ SUSP
40.0000 mg | Freq: Once | INTRAMUSCULAR | Status: AC
  Administered 2015-02-26: 40 mg via INTRAMUSCULAR

## 2015-02-26 MED ORDER — METHYLPREDNISOLONE ACETATE 80 MG/ML IJ SUSP
INTRAMUSCULAR | Status: AC
  Filled 2015-02-26: qty 1

## 2015-02-26 MED ORDER — MECLIZINE HCL 50 MG PO TABS: 50.0000 mg | ORAL_TABLET | Freq: Three times a day (TID) | ORAL | Status: DC | PRN

## 2015-02-26 MED ORDER — TRIAMCINOLONE ACETONIDE 40 MG/ML IJ SUSP
INTRAMUSCULAR | Status: AC
  Filled 2015-02-26: qty 1

## 2015-02-26 NOTE — ED Notes (Signed)
Discharge is pending medication administration

## 2015-02-26 NOTE — ED Notes (Signed)
C/o dizziness for one week, c/o headache.  Intermittent nausea

## 2015-02-26 NOTE — ED Provider Notes (Signed)
CSN: [id number]     Arrival date & time 02/26/15  1537 History   First MD Initiated Contact with Patient 02/26/15 1627     No chief complaint on file.  (Consider location/radiation/quality/duration/timing/severity/associated sxs/prior Treatment) Patient is a 34 y.o. female presenting with dizziness. The history is provided by the patient.  Dizziness Quality:  Lightheadedness Severity:  Mild Onset quality:  Gradual Duration:  1 week Progression:  Unchanged Chronicity:  Chronic Context: bending over   Context: not with loss of consciousness   Relieved by:  None tried Worsened by:  Nothing Ineffective treatments:  None tried Associated symptoms: headaches   Associated symptoms: no hearing loss, no palpitations, no shortness of breath, no syncope, no tinnitus, no vision changes and no vomiting     Past Medical History  Diagnosis Date  . Depression   . Arthritis    Past Surgical History  Procedure Laterality Date  . Dilatation & curettage/hysteroscopy with trueclear     Family History  Problem Relation Age of Onset  . Hypertension Mother   . Rheum arthritis Mother   . Cancer Mother     breast  . Clotting disorder Mother   . Bell's palsy Mother    Social History  Substance Use Topics  . Smoking status: Never Smoker   . Smokeless tobacco: Not on file  . Alcohol Use: No   OB History    No data available     Review of Systems  Constitutional: Negative.   HENT: Positive for congestion. Negative for hearing loss and tinnitus.   Respiratory: Negative.  Negative for shortness of breath.   Cardiovascular: Negative.  Negative for palpitations and syncope.  Gastrointestinal: Negative for vomiting.  Neurological: Positive for dizziness and headaches. Negative for syncope, speech difficulty, light-headedness and numbness.  All other systems reviewed and are negative.   Allergies  Codeine; Nsaids; Aleve; Amoxicillin; Asa; Ibuprofen; Penicillins; and Sulfa antibiotics  Home  Medications   Prior to Admission medications   Medication Sig Start Date End Date Taking? Authorizing Provider  clonazePAM (KLONOPIN) 0.5 MG tablet Take 0.5 mg by mouth 2 (two) times daily as needed for anxiety.    Historical Provider, MD  diphenoxylate-atropine (LOMOTIL) 2.5-0.025 MG per tablet Take 1 tablet by mouth 3 (three) times daily as needed for diarrhea or loose stools. 03/15/14   Sean Hommel, DO  EPINEPHrine (EPIPEN) 0.3 mg/0.3 mL IJ SOAJ injection Inject into the muscle once. 01/25/14   Sean Hommel, DO  ipratropium (ATROVENT) 0.06 % nasal spray Place 2 sprays into both nostrils 4 (four) times daily. 02/26/15   Linna Hoff, MD  meclizine (ANTIVERT) 50 MG tablet Take 1 tablet (50 mg total) by mouth 3 (three) times daily as needed. For dizziness 02/26/15   Linna Hoff, MD  ondansetron (ZOFRAN) 4 MG tablet Take 1-2 tablets (4-8 mg total) by mouth every 8 (eight) hours as needed for nausea or vomiting. 06/20/14   Laren Boom, DO  SUMAtriptan Succinate (SUMAVEL DOSEPRO) 6 MG/0.5ML SOTJ Inject one dose in the thigh or abdomen at onset of migraine, may repeat once in an hour if needed.  Max 2 doses/24hours. 01/05/15   Sean Hommel, DO  venlafaxine XR (EFFEXOR XR) 75 MG 24 hr capsule Two by mouth every day if tolerated. 01/05/15   Sean Hommel, DO  zolpidem (AMBIEN CR) 12.5 MG CR tablet TAKE 1 TABLET BY MOUTH AT BEDTIME AS NEEDED FOR SLEEP 01/22/15   Laren Boom, DO   Meds Ordered and Administered this Visit  Medications  triamcinolone acetonide (KENALOG-40) injection 40 mg (not administered)  methylPREDNISolone acetate (DEPO-MEDROL) injection 80 mg (not administered)    BP 111/69 mmHg  Pulse 77  Temp(Src) 98 F (36.7 C) (Oral)  Resp 16  SpO2 98% No data found.   Physical Exam  Constitutional: She is oriented to person, place, and time. She appears well-developed and well-nourished. No distress.  HENT:  Right Ear: External ear normal.  Left Ear: External ear normal.  Mouth/Throat:  Oropharynx is clear and moist.  Eyes: Conjunctivae and EOM are normal. Pupils are equal, round, and reactive to light.  Neck: Normal range of motion. Neck supple.  Cardiovascular: Normal rate and normal heart sounds.   Pulmonary/Chest: Effort normal and breath sounds normal.  Lymphadenopathy:    She has no cervical adenopathy.  Neurological: She is alert and oriented to person, place, and time. No cranial nerve deficit. Coordination normal.  Skin: Skin is warm and dry.  Nursing note and vitals reviewed.   ED Course  Procedures (including critical care time)  Labs Review Labs Reviewed - No data to display  Imaging Review No results found.   Visual Acuity Review  Right Eye Distance:   Left Eye Distance:   Bilateral Distance:    Right Eye Near:   Left Eye Near:    Bilateral Near:         MDM   1. Nonallergic vasomotor rhinitis     rx for atrovent and meclizine    Linna Hoff, MD 02/26/15 [id number]

## 2015-02-26 NOTE — Discharge Instructions (Signed)
Drink plenty of fluids, use medicine as prescribed, see your doctor if further problems. °

## 2015-03-08 ENCOUNTER — Ambulatory Visit (INDEPENDENT_AMBULATORY_CARE_PROVIDER_SITE_OTHER): Payer: 59 | Admitting: Family Medicine

## 2015-03-08 ENCOUNTER — Encounter: Payer: Self-pay | Admitting: Family Medicine

## 2015-03-08 VITALS — BP 108/69 | HR 75 | Wt 126.0 lb

## 2015-03-08 DIAGNOSIS — F509 Eating disorder, unspecified: Secondary | ICD-10-CM | POA: Diagnosis not present

## 2015-03-08 LAB — CBC
HCT: 37.8 % (ref 36.0–46.0)
HEMOGLOBIN: 12.6 g/dL (ref 12.0–15.0)
MCH: 27 pg (ref 26.0–34.0)
MCHC: 33.3 g/dL (ref 30.0–36.0)
MCV: 81.1 fL (ref 78.0–100.0)
MPV: 9.8 fL (ref 8.6–12.4)
PLATELETS: 172 10*3/uL (ref 150–400)
RBC: 4.66 MIL/uL (ref 3.87–5.11)
RDW: 14.2 % (ref 11.5–15.5)
WBC: 4.1 10*3/uL (ref 4.0–10.5)

## 2015-03-08 LAB — COMPLETE METABOLIC PANEL WITH GFR
ALBUMIN: 4.1 g/dL (ref 3.6–5.1)
ALT: 14 U/L (ref 6–29)
AST: 15 U/L (ref 10–30)
Alkaline Phosphatase: 48 U/L (ref 33–115)
BUN: 14 mg/dL (ref 7–25)
CHLORIDE: 104 mmol/L (ref 98–110)
CO2: 29 mmol/L (ref 20–31)
Calcium: 9.2 mg/dL (ref 8.6–10.2)
Creat: 0.66 mg/dL (ref 0.50–1.10)
GFR, Est African American: >89 mL/min (ref >=60)
GLUCOSE: 63 mg/dL — AB (ref 65–99)
POTASSIUM: 4.1 mmol/L (ref 3.5–5.3)
SODIUM: 142 mmol/L (ref 135–146)
Total Bilirubin: 0.3 mg/dL (ref 0.2–1.2)
Total Protein: 6.5 g/dL (ref 6.1–8.1)

## 2015-03-08 MED ORDER — DULOXETINE HCL 20 MG PO CPEP: 20.0000 mg | ORAL_CAPSULE | Freq: Every day | ORAL | Status: DC

## 2015-03-08 NOTE — Progress Notes (Signed)
CC: Gwendolyn Bowen is a 34 y.o. female is here for discuss wt gain   Subjective: HPI:  Needs fmla changed to twice a month.  Reports dissatisfaction with body image. She believes that she is overweight and has been dealing with this since she was a teenager but over the last few months symptoms have gotten much worse. She tells me she works out 3 times a day, consumes no more than 800 cal a day but still gains weight. She tells me it is not uncommon for her to force herself to vomit if she is feeling fat. She is not certain whether symptoms got better or worse after starting Effexor. She tells me her stomach feels upset, she feels fatigue, and just plan to feel sick. There has been no fever, cough, shortness of breath or wheezing. She has not experienced any diarrhea or constipation. She has been seeing a therapist however the therapist has refused to continue to see them as a couple unless the patient gets help for her eating disorder. She admits to long-standing anxiety and depression. Zoloft and Lexapro did not help any of the symptoms surrounding her eating disorder in the past. No thoughts when harm self or others.  She's noticed less that she eats the worse her headaches are. She is having to take Imitrex 3-4 times a month, missing work about 2 times a month due to headaches. Denies change in the character or severity of headaches. Changes have only been noticed with respect to frequency.   Review Of Systems Outlined In HPI  Past Medical History  Diagnosis Date  . Depression   . Arthritis     Past Surgical History  Procedure Laterality Date  . Dilatation & curettage/hysteroscopy with trueclear     Family History  Problem Relation Age of Onset  . Hypertension Mother   . Rheum arthritis Mother   . Cancer Mother     breast  . Clotting disorder Mother   . Bell's palsy Mother     Social History   Social History  . Marital Status: Married    Spouse Name: N/A  . Number of  Children: N/A  . Years of Education: N/A   Occupational History  . Not on file.   Social History Main Topics  . Smoking status: Never Smoker   . Smokeless tobacco: Not on file  . Alcohol Use: No  . Drug Use: No  . Sexual Activity: Yes   Other Topics Concern  . Not on file   Social History Narrative     Objective: BP 108/69 mmHg  Pulse 75  Wt 126 lb (57.153 kg)  LMP 04/27/2014  Vital signs reviewed. General: Alert and Oriented, No Acute Distress HEENT: Pupils equal, round, reactive to light. Conjunctivae clear.  External ears unremarkable.  Moist mucous membranes. Lungs: Clear and comfortable work of breathing, speaking in full sentences without accessory muscle use. Cardiac: Regular rate and rhythm.  Neuro: CN II-XII grossly intact, gait normal. Extremities: No peripheral edema.  Strong peripheral pulses.  Mental Status: No depression, nor agitation. Mildly anxious. Logical though process. Skin: Warm and dry.  Assessment & Plan: Gwendolyn Bowen was seen today for discuss wt gain.  Diagnoses and all orders for this visit:  Eating disorder -     Ambulatory referral to diabetic education -     DG Bone Density -     CBC -     COMPLETE METABOLIC PANEL WITH GFR -     Ambulatory referral to  Psychiatry -     DULoxetine (CYMBALTA) 20 MG capsule; Take 1 capsule (20 mg total) by mouth daily.  Eating disorder: Adding Cymbalta to Effexor. Checking bone density and metabolic panel to look for any dangerous changes to physiology. Checking a CBC to rule out anemia. Referral to both nutrition and psychiatry for further management.Signs and symptoms requring emergent/urgent reevaluation were discussed with the patient. An EKG was obtained and fortunately was reassuring showing a normal sinus rhythm, normal axis, no pathologic Q waves, and no ST segment elevation or depression. No signs of ventricular hypertrophy.  Return in about 4 weeks (around 04/05/2015).

## 2015-03-08 NOTE — Patient Instructions (Signed)
1. Get labs done downstairs I will call you about results tomorrow 2. After getting labs go to the radiology office and asked to get scheduled for your bone density scan 3. After scheduling your bone density scan go to the psychiatry office downstairs and asked to be scheduled to establish care. 4. Cymbalta was sent to your pharmacy you can take this along with Effexor until you see psychiatry. 5. If you have not been contacted by the nutritionist by next week please let me know so I can find out what's taking so long. 6. In order to change FMLA you need to contact human resources to send a new FMLA paperwork.

## 2015-03-13 ENCOUNTER — Ambulatory Visit (HOSPITAL_BASED_OUTPATIENT_CLINIC_OR_DEPARTMENT_OTHER)
Admission: RE | Admit: 2015-03-13 | Discharge: 2015-03-13 | Disposition: A | Discharge status: Home / Self Care | Payer: 59 | Source: Ambulatory Visit | Attending: Family Medicine | Admitting: Family Medicine

## 2015-03-13 DIAGNOSIS — Z8262 Family history of osteoporosis: Secondary | ICD-10-CM | POA: Insufficient documentation

## 2015-03-13 DIAGNOSIS — F502 Bulimia nervosa: Secondary | ICD-10-CM | POA: Insufficient documentation

## 2015-03-13 DIAGNOSIS — F509 Eating disorder, unspecified: Secondary | ICD-10-CM | POA: Diagnosis present

## 2015-03-15 NOTE — Addendum Note (Signed)
Addended by: Collie Siad on: 03/15/2015 11:26 AM   Modules accepted: Orders

## 2015-03-16 ENCOUNTER — Telehealth: Payer: Self-pay | Admitting: Family Medicine

## 2015-03-16 NOTE — Telephone Encounter (Signed)
Left message on vm

## 2015-03-16 NOTE — Telephone Encounter (Signed)
Sue Lush, Will you please let patient know that there was a post it note on my desk today that says "Lockie Pares FMLA Papers".  I have not received any FMLA papers from her human resources department since I saw her last.  I would recommend that she bring this to the attention of her HR department so they will send me these papers.

## 2015-03-27 ENCOUNTER — Ambulatory Visit (HOSPITAL_COMMUNITY): Payer: 59 | Admitting: Licensed Clinical Social Worker

## 2015-04-10 ENCOUNTER — Encounter: Payer: Self-pay | Admitting: *Deleted

## 2015-04-10 ENCOUNTER — Encounter: Payer: 59 | Attending: Family Medicine | Admitting: *Deleted

## 2015-04-10 DIAGNOSIS — F509 Eating disorder, unspecified: Secondary | ICD-10-CM

## 2015-04-10 DIAGNOSIS — Z713 Dietary counseling and surveillance: Secondary | ICD-10-CM | POA: Insufficient documentation

## 2015-04-10 NOTE — Progress Notes (Signed)
Appointment start time: 1520  Appointment end time: 1600  Patient was seen on 04/10/15 for nutrition counseling pertaining to disordered eating  Primary care provider: Dr. Ivan AnchorsHommel Therapist: none yet; trying to find a therapist Any other medical team members: none   Assessment: This is Gwendolyn Bowen's initial assessment for her eating disorder.  She got her appointment time confused and arrived 20 minutes later.  She is accompanied by her 2 young children.  States [facility] upon referral from PCP for eating disorder.  The person she saw at [facility] did not specialize in eating disorders and gave her a list of local therapists who do specialize and she is planning on calling those options soon.  States she was reluctant to attend today's visit, but knows her eating is not appropriate and is concerned that her daughters may one day be at risk for disordered eating and wants to be healthy for them.   Since a teenager (age [age]) has struggled with ED.  States she had bulimia at one point.  This was for a long time, per patient.  Lost down to the weight she thought she wanted, but realized it was "never enough".  Maintained for awhile.  Struggles in cycles where she is maintaining, then losing weight. She is very upset with her current weight and would like to weigh 115 lb.     States she has been working out excessively: 3 times/day.  Walks on lunch hour and then works out in fitness center.  Exercise does help with stress/anxiety, but she realizes that is excessive.  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 When she went to see her PCP she was really struggling with physical exertion (from inadequate food intake) and she has since decreased her exercise.  Reported chest pains previously.  Realizes she wasn't taking care of herself at that time.  States she was not eating at all at one point. (currently eats 500-600 kcal, per patient). Then she started eating some at dinnertime for a couple months.   Since visit with Dr. Ivan AnchorsHommel, she is trying to eat more.  Does count calories and tries to compensate if she eats more than she thinks she should.  Feels very guilty when she eats "bad foods" and she immediately panics.  Per patient, her PCP recommended 2000 kcal. She does not meet this goal  Reports body dysmorphia.  See herself as weighing several hundred pounds.  States she doesn't really know how to eat healthfully as she's always eaten "too little" or binged because she restricted  She has 3 daughters.  Her middle daghter has anxiety, ADHD, separation anxiety and that is hard for [name] to try and manage.  She also works a part-time job doing Pharmacist, communityhousekeeping   Growth Metrics:  TANITA  BODY COMP RESULTS  04/10/15   BMI (kg/m^2) 22.9   Fat Mass (lbs) 39   Fat Free Mass (lbs) 90   Total Body Water (lbs) 66  % body fat 30.3    "Ideal body weight":  115 + 11.5 lb Weight  today: 129 lb % Ideal today:  102% highest end of range However, goal weight may not be "ideal" as her bodily functions are compromised at current weight  Medical Information:  Changes in hair, skin, nails since ED started: yes LMP without the use of hormones: no LMP for s/p hysterectomy.  Prior to that cycles were always very irregular Positive for fatigue Positive for mood change, irritabilty, difficulty focusing/concentrating Had bone  density study done.  Per patient, scan reveals weakness in her hip Worsening headaches Hypoglycemia    Eating history: Length of time: 15 years Previous treatments: none Goals for RD meetings: learn how to eat.  Would like to maintain low weight (115 lb)  Weight history:  Highest weight outside of pregnancy: 150 lb     Lowest adult weight: 110 lb Most consistent weight: ~118lb   What would you like to weigh:115 How has weight changed in the past year:  Net gain ~15 lb   Mental health diagnosis: none currently.  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  Will confirm when therapy is  initiated   Dietary assessment: Is concerned about her weight gain.  Seldom eats out (may eat some of her daughter's food)  Doesn't fry foods at all.  Watches sugar intake  Safe foods include: anything "low calorie" Avoided foods include:fries, pizza (loves these foods), pasta, bread (chooses low cal)  24 hour recall:  B: blueberry bagel with blueberry cream cheese L: wrap with [place] and veggies, few tortilla chips S: mini twix D: 2 fat free hot dogs with ketchup, mustard, slw. And low cal bun. Baked beans Beverages: water and coke zero Double energy shots form [employer]  Administered EAT-26 Score significant > 20 Patient score: 56   What Methods Do You Use To Control Your Weight (Compensatory behaviors)?           Restricting (calories, fat, carbs): 500-600 kcal/day.  When she goes over it's very stressful  SIV: 2-3 times/month  Diet pills: would like to initiate this, but has not yet  Laxatives: 1/month  Diuretics: 1/month  Alcohol or drugs: none reported  Exercise (what type): 2-3 hours most days, not as much currently.  Now 40 minutes several days/week  Food rules or rituals (explain): none reported  Binge: 1/month reported  Estimated energy intake: 1100 kcal yesterday   Estimated energy needs: BMR: 1363 kcal x energy factor = 2400 kcal 300 g CHO 120 g pro 80 g fat  Nutrition Diagnosis: NI-1.4 Inadequate energy intake As related to eating disorder.  As evidenced by dietary recall.  Intervention/Goals: Explained role of outpatient ED team: nutritionists and therapist.  Recommended regular visits with both.   Discussed "what happens when I don't eat" and how the symptoms she's experiencing are due to inadequate calorie intake. Explained that her "goal weight" is when her body functions again properly, not a specific number.  Explained this provider's role to to help improve her relationship with food and her body.  Encouraged her to call ED therapist to get that set  up. Gave instructions on completing food log for 1 week   Monitoring and Evaluation: Patient will follow up in 1 weeks.  Recommend following up with physician as he requested ~1 month follow up that has not been scheduled

## 2015-04-17 ENCOUNTER — Other Ambulatory Visit: Payer: Self-pay | Admitting: Family Medicine

## 2015-04-18 NOTE — Telephone Encounter (Signed)
Pt is due for f/u visit

## 2015-04-19 ENCOUNTER — Ambulatory Visit: Payer: Self-pay | Admitting: *Deleted

## 2015-05-11 ENCOUNTER — Other Ambulatory Visit: Payer: Self-pay | Admitting: Family Medicine

## 2015-05-22 ENCOUNTER — Encounter: Payer: Self-pay | Admitting: Family Medicine

## 2015-05-22 ENCOUNTER — Ambulatory Visit (INDEPENDENT_AMBULATORY_CARE_PROVIDER_SITE_OTHER): Payer: 59 | Admitting: Family Medicine

## 2015-05-22 VITALS — BP 113/73 | HR 94

## 2015-05-22 DIAGNOSIS — J209 Acute bronchitis, unspecified: Secondary | ICD-10-CM | POA: Diagnosis not present

## 2015-05-22 MED ORDER — LEVOFLOXACIN 500 MG PO TABS: 500.0000 mg | ORAL_TABLET | Freq: Every day | ORAL | Status: DC

## 2015-05-22 NOTE — Progress Notes (Signed)
CC: Gwendolyn Bowen is a 34 y.o. female is here for Sore Throat; Ear Fullness; and Hoarse   Subjective: HPI:  Cough, wheezing at night, fatigue, hoarse throat and sore throat that's been present for the past week. Nothing seems to make it better or worse. No interventions as of yet. Symptoms are moderate in severity and persistent throughout the day and night. Reports fatigue. Symptoms are persistent and not getting better or worse. She reports occasional subjective fever and chills. She denies shortness of breath, blood in sputum, nasal congestion or postnasal drip. Denies skin changes but has had some myalgias   Review Of Systems Outlined In HPI  Past Medical History  Diagnosis Date  . Depression   . Arthritis     Past Surgical History  Procedure Laterality Date  . Dilatation & curettage/hysteroscopy with trueclear     Family History  Problem Relation Age of Onset  . Hypertension Mother   . Rheum arthritis Mother   . Cancer Mother     breast  . Clotting disorder Mother   . Bell's palsy Mother     Social History   Social History  . Marital Status: Married    Spouse Name: N/A  . Number of Children: N/A  . Years of Education: N/A   Occupational History  . Not on file.   Social History Main Topics  . Smoking status: Never Smoker   . Smokeless tobacco: Not on file  . Alcohol Use: No  . Drug Use: No  . Sexual Activity: Yes   Other Topics Concern  . Not on file   Social History Narrative     Objective: BP 113/73 mmHg  Pulse 94  LMP 04/27/2014  General: Alert and Oriented, No Acute Distress HEENT: Pupils equal, round, reactive to light. Conjunctivae clear.  External ears unremarkable, canals clear with intact TMs with appropriate landmarks.  Middle ear appears open without effusion. Pink inferior turbinates.  Moist mucous membranes, pharynx without inflammation nor lesions.  Neck supple without palpable lymphadenopathy nor abnormal masses. Lungs: trace expiratory  wheezing, no rhonchi or rales. Frequent coughing Cardiac: Regular rate and rhythm. Normal S1/S2.  No murmurs, rubs, nor gallops.   Extremities: No peripheral edema.  Strong peripheral pulses.  Mental Status: No depression, anxiety, nor agitation. Skin: Warm and dry.  Assessment & Plan: Gwendolyn RoundsSally was seen today for sore throat, ear fullness and hoarse.  Diagnoses and all orders for this visit:  Acute bronchitis, unspecified organism -     levofloxacin (LEVAQUIN) 500 MG tablet; Take 1 tablet (500 mg total) by mouth daily.   Acute bronchitis: She would like to avoid prednisone for fear of weight gain, will try levofloxacin first and if cough/wheezing is still persistent on Thursday call for prednisone perception.  Return if symptoms worsen or fail to improve.

## 2015-05-25 ENCOUNTER — Telehealth: Payer: Self-pay

## 2015-05-25 NOTE — Telephone Encounter (Signed)
Pt asked that you go back over her [employer] papers to make sure that the correct changes where made.  She stated that her boss said that the papers continued to say that she can only take off every 2 months for her migraines, and from her understanding that was suppose to changed.

## 2015-05-28 NOTE — Telephone Encounter (Signed)
New FMLA was completed for her absence in late November due to bronchitis.  No changes to prior FMLA for migraine.

## 2015-05-29 ENCOUNTER — Telehealth: Payer: Self-pay

## 2015-05-29 NOTE — Telephone Encounter (Signed)
error 

## 2015-05-29 NOTE — Telephone Encounter (Signed)
Pt.notified

## 2015-05-29 NOTE — Telephone Encounter (Addendum)
Pt reports that she needs for her FMLA paperwork to say that its ok for her to take days off as needed monthly for headaches.

## 2015-05-29 NOTE — Telephone Encounter (Signed)
Please ask her to ask her HR department to send me new FMLA orders, the one I received late last month was for the time she took off for her recent respiratory and sinus infection.

## 2015-06-12 ENCOUNTER — Telehealth: Payer: Self-pay | Admitting: Family Medicine

## 2015-06-12 NOTE — Telephone Encounter (Signed)
Will you please let patient know that the company handling her FMLA request would not accept leave to be taken on a PRN basis. They needed an actual number so I estimated 4 episodes a month lasting up to two days at a time.

## 2015-06-12 NOTE — Telephone Encounter (Signed)
Pt.notified

## 2015-07-25 ENCOUNTER — Other Ambulatory Visit: Payer: Self-pay

## 2015-07-25 MED ORDER — ZOLPIDEM TARTRATE ER 12.5 MG PO TBCR: 12.5000 mg | EXTENDED_RELEASE_TABLET | Freq: Every evening | ORAL | Status: DC | PRN

## 2015-08-08 ENCOUNTER — Encounter: Payer: Self-pay | Admitting: Family Medicine

## 2015-08-08 ENCOUNTER — Ambulatory Visit (INDEPENDENT_AMBULATORY_CARE_PROVIDER_SITE_OTHER): Payer: 59 | Admitting: Family Medicine

## 2015-08-08 VITALS — BP 109/69 | HR 89 | Temp 98.0°F | Wt 130.0 lb

## 2015-08-08 DIAGNOSIS — B9689 Other specified bacterial agents as the cause of diseases classified elsewhere: Secondary | ICD-10-CM

## 2015-08-08 DIAGNOSIS — J329 Chronic sinusitis, unspecified: Secondary | ICD-10-CM

## 2015-08-08 DIAGNOSIS — A499 Bacterial infection, unspecified: Secondary | ICD-10-CM

## 2015-08-08 MED ORDER — DOXYCYCLINE HYCLATE 100 MG PO TABS: ORAL_TABLET | ORAL | Status: DC

## 2015-08-08 NOTE — Progress Notes (Signed)
CC: Gwendolyn Bowen is a 35 y.o. female is here for Sinusitis   Subjective: HPI:  Facial pressure in the cheeks, postnasal drip, and productive cough all present for the past week. Seems to be worsening on daily basis. She's been experiencing chills and fatigue. Symptoms are worse at night occasionally causing wheezing when she is trying to fall sleep. She denies any shortness of breath or pain with breathing. Denies blood in sputum. No benefit from Mucinex, Tylenol cough and cold products. Denies confusion, rash, fever or swollen lymph nodes   Review Of Systems Outlined In HPI  Past Medical History  Diagnosis Date  . Depression   . Arthritis     Past Surgical History  Procedure Laterality Date  . Dilatation & curettage/hysteroscopy with trueclear     Family History  Problem Relation Age of Onset  . Hypertension Mother   . Rheum arthritis Mother   . Cancer Mother     breast  . Clotting disorder Mother   . Bell's palsy Mother     Social History   Social History  . Marital Status: Married    Spouse Name: N/A  . Number of Children: N/A  . Years of Education: N/A   Occupational History  . Not on file.   Social History Main Topics  . Smoking status: Never Smoker   . Smokeless tobacco: Not on file  . Alcohol Use: No  . Drug Use: No  . Sexual Activity: Yes   Other Topics Concern  . Not on file   Social History Narrative     Objective: BP 109/69 mmHg  Pulse 89  Temp(Src) 98 F (36.7 C) (Oral)  Wt 130 lb (58.968 kg)  LMP 04/27/2014  General: Alert and Oriented, No Acute Distress HEENT: Pupils equal, round, reactive to light. Conjunctivae clear.  External ears unremarkable, canals clear with intact TMs with appropriate landmarks.  Middle ear appears open without effusion. Pink inferior turbinates.  Moist mucous membranes, pharynx without inflammation nor lesions.  Neck supple without palpable lymphadenopathy nor abnormal masses. Lungs: Clear to auscultation  bilaterally, no wheezing/ronchi/rales.  Comfortable work of breathing. Good air movement.frequent coughing Cardiac: Regular rate and rhythm. Normal S1/S2.  No murmurs, rubs, nor gallops.   Extremities: No peripheral edema.  Strong peripheral pulses.  Mental Status: No depression, anxiety, nor agitation. Skin: Warm and dry.  Assessment & Plan: Dariana was seen today for sinusitis.  Diagnoses and all orders for this visit:  Bacterial sinusitis -     doxycycline (VIBRA-TABS) 100 MG tablet; One by mouth twice a day for ten days.   Suspect bacterial sinusitis causing post nasal drip and cough therefore start nasal saline washes and doxycycline.Signs and symptoms requring emergent/urgent reevaluation were discussed with the patient.  Return if symptoms worsen or fail to improve.

## 2015-08-13 ENCOUNTER — Telehealth: Payer: Self-pay

## 2015-08-13 ENCOUNTER — Encounter: Payer: Self-pay | Admitting: Family Medicine

## 2015-08-13 ENCOUNTER — Ambulatory Visit (INDEPENDENT_AMBULATORY_CARE_PROVIDER_SITE_OTHER): Payer: 59 | Admitting: Family Medicine

## 2015-08-13 VITALS — BP 115/77 | HR 105 | Temp 98.3°F

## 2015-08-13 DIAGNOSIS — A499 Bacterial infection, unspecified: Secondary | ICD-10-CM

## 2015-08-13 DIAGNOSIS — J329 Chronic sinusitis, unspecified: Secondary | ICD-10-CM

## 2015-08-13 DIAGNOSIS — B9689 Other specified bacterial agents as the cause of diseases classified elsewhere: Secondary | ICD-10-CM

## 2015-08-13 MED ORDER — LEVOFLOXACIN 500 MG PO TABS: 500.0000 mg | ORAL_TABLET | Freq: Every day | ORAL | Status: DC

## 2015-08-13 MED ORDER — METHYLPREDNISOLONE ACETATE 80 MG/ML IJ SUSP
80.0000 mg | Freq: Once | INTRAMUSCULAR | Status: AC
  Administered 2015-08-13: 80 mg via INTRAMUSCULAR

## 2015-08-13 NOTE — Addendum Note (Signed)
Addended by: Thom Chimes on: 08/13/2015 03:21 PM   Modules accepted: Orders

## 2015-08-13 NOTE — Telephone Encounter (Signed)
Kortne states she is still sick and would like a different antibiotic. She complains of congestion, cough, sore throat, runny nose, chills, sweats and fever. Please advise.

## 2015-08-13 NOTE — Progress Notes (Signed)
CC: Gwendolyn Bowen is a 35 y.o. female is here for Sinusitis   Subjective: HPI:  Continued facial pressure in the forehead, nasal congestion, sore throat and nonproductive cough. No benefit from doxycycline. No other interventions other than above since I saw her last. She had a fever yesterday of an unknown degree. She's had some chills and fatigue. Statessymptoms are currently moderate in severity and worsened in the evening. Denies confusion, shortness of breath, no chest discomfort.   Review Of Systems Outlined In HPI  Past Medical History  Diagnosis Date  . Depression   . Arthritis     Past Surgical History  Procedure Laterality Date  . Dilatation & curettage/hysteroscopy with trueclear     Family History  Problem Relation Age of Onset  . Hypertension Mother   . Rheum arthritis Mother   . Cancer Mother     breast  . Clotting disorder Mother   . Bell's palsy Mother     Social History   Social History  . Marital Status: Married    Spouse Name: N/A  . Number of Children: N/A  . Years of Education: N/A   Occupational History  . Not on file.   Social History Main Topics  . Smoking status: Never Smoker   . Smokeless tobacco: Not on file  . Alcohol Use: No  . Drug Use: No  . Sexual Activity: Yes   Other Topics Concern  . Not on file   Social History Narrative     Objective: BP 115/77 mmHg  Pulse 105  Temp(Src) 98.3 F (36.8 C) (Oral)  Wt   LMP 04/27/2014  General: Alert and Oriented, No Acute Distress HEENT: Pupils equal, round, reactive to light. Conjunctivae clear.  External ears unremarkable, canals clear with intact TMs with appropriate landmarks.  Middle ear appears open without effusion. Pink inferior turbinates.  Moist mucous membranes, pharynx without inflammation nor lesions.  Neck supple without palpable lymphadenopathy nor abnormal masses. Lungs: Clear to auscultation bilaterally, no wheezing/ronchi/rales.  Comfortable work of breathing. Good  air movement.frequent coughing Cardiac: Regular rate and rhythm. Normal S1/S2.  No murmurs, rubs, nor gallops.   Extremities: No peripheral edema.  Strong peripheral pulses.  Mental Status: No depression, anxiety, nor agitation. Skin: Warm and dry.  Assessment & Plan: Endiya was seen today for sinusitis.  Diagnoses and all orders for this visit:  Bacterial sinusitis -     levofloxacin (LEVAQUIN) 500 MG tablet; Take 1 tablet (500 mg total) by mouth daily.   Start Levaquin, consider nasal saline washes. She will also receive Depo-Medrol injection today. Call if no better by Wednesday  Return if symptoms worsen or fail to improve.

## 2015-08-14 DIAGNOSIS — R5383 Other fatigue: Secondary | ICD-10-CM | POA: Diagnosis not present

## 2015-08-14 DIAGNOSIS — N951 Menopausal and female climacteric states: Secondary | ICD-10-CM | POA: Diagnosis not present

## 2015-08-24 DIAGNOSIS — Z01419 Encounter for gynecological examination (general) (routine) without abnormal findings: Secondary | ICD-10-CM | POA: Diagnosis not present

## 2015-09-03 ENCOUNTER — Ambulatory Visit (INDEPENDENT_AMBULATORY_CARE_PROVIDER_SITE_OTHER): Payer: 59 | Admitting: Family Medicine

## 2015-09-03 ENCOUNTER — Encounter: Payer: Self-pay | Admitting: Family Medicine

## 2015-09-03 VITALS — BP 105/63 | HR 73 | Temp 97.9°F | Wt 133.0 lb

## 2015-09-03 DIAGNOSIS — J329 Chronic sinusitis, unspecified: Secondary | ICD-10-CM | POA: Diagnosis not present

## 2015-09-03 DIAGNOSIS — A499 Bacterial infection, unspecified: Secondary | ICD-10-CM

## 2015-09-03 DIAGNOSIS — B9689 Other specified bacterial agents as the cause of diseases classified elsewhere: Secondary | ICD-10-CM

## 2015-09-03 MED ORDER — CEFDINIR 300 MG PO CAPS: 300.0000 mg | ORAL_CAPSULE | Freq: Two times a day (BID) | ORAL | Status: AC

## 2015-09-03 MED ORDER — METHYLPREDNISOLONE ACETATE 80 MG/ML IJ SUSP
80.0000 mg | Freq: Once | INTRAMUSCULAR | Status: AC
  Administered 2015-09-03: 80 mg via INTRAMUSCULAR

## 2015-09-03 NOTE — Progress Notes (Signed)
CC: Gwendolyn Bowen is a 35 y.o. female is here for Sinusitis   Subjective: HPI:   Facial pressure in the left cheek present for the last week and a half. No benefit from Mucinex products. No other interventions as of yet. Accompanied by nasal congestion and postnasal drip. Symptoms are moderate in severity all hours of the day. Denies cough, wheezing, shortness of breath or fever.  Review Of Systems Outlined In HPI  Past Medical History  Diagnosis Date  . Depression   . Arthritis     Past Surgical History  Procedure Laterality Date  . Dilatation & curettage/hysteroscopy with trueclear     Family History  Problem Relation Age of Onset  . Hypertension Mother   . Rheum arthritis Mother   . Cancer Mother     breast  . Clotting disorder Mother   . Bell's palsy Mother     Social History   Social History  . Marital Status: Married    Spouse Name: N/A  . Number of Children: N/A  . Years of Education: N/A   Occupational History  . Not on file.   Social History Main Topics  . Smoking status: Never Smoker   . Smokeless tobacco: Not on file  . Alcohol Use: No  . Drug Use: No  . Sexual Activity: Yes   Other Topics Concern  . Not on file   Social History Narrative     Objective: BP 105/63 mmHg  Pulse 73  Temp(Src) 97.9 F (36.6 C) (Oral)  Wt 133 lb (60.328 kg)  LMP 04/27/2014  General: Alert and Oriented, No Acute Distress HEENT: Pupils equal, round, reactive to light. Conjunctivae clear.  External ears unremarkable, canals clear with intact TMs with appropriate landmarks.  Middle ear appears open without effusion. Pink inferior turbinates.  Moist mucous membranes, pharynx without inflammation nor lesions.  Neck supple without palpable lymphadenopathy nor abnormal masses. Lungs: Clear comfortable work of breathing Cardiac: Regular rate and rhythm.  Extremities: No peripheral edema.  Strong peripheral pulses.  Mental Status: No depression, anxiety, nor  agitation. Skin: Warm and dry.  Assessment & Plan: [name] was seen today for sinusitis.  Diagnoses and all orders for this visit:  Bacterial sinusitis -     methylPREDNISolone acetate (DEPO-MEDROL) injection 80 mg; Inject 1 mL (80 mg total) into the muscle once.  Other orders -     cefdinir (OMNICEF) 300 MG capsule; Take 1 capsule (300 mg total) by mouth 2 (two) times daily.   Bacterial sinusitis: Start Omnicef and she also received a shot of Depo-Medrol today.  Return if symptoms worsen or fail to improve.

## 2015-09-19 ENCOUNTER — Other Ambulatory Visit: Payer: Self-pay | Admitting: Family Medicine

## 2015-09-21 NOTE — Telephone Encounter (Signed)
rx req

## 2015-11-27 ENCOUNTER — Ambulatory Visit (INDEPENDENT_AMBULATORY_CARE_PROVIDER_SITE_OTHER): Payer: 59 | Admitting: Family Medicine

## 2015-11-27 ENCOUNTER — Encounter: Payer: Self-pay | Admitting: Family Medicine

## 2015-11-27 VITALS — BP 110/73 | HR 76 | Wt 132.0 lb

## 2015-11-27 DIAGNOSIS — G43009 Migraine without aura, not intractable, without status migrainosus: Secondary | ICD-10-CM

## 2015-11-27 DIAGNOSIS — J329 Chronic sinusitis, unspecified: Secondary | ICD-10-CM

## 2015-11-27 DIAGNOSIS — B9689 Other specified bacterial agents as the cause of diseases classified elsewhere: Secondary | ICD-10-CM

## 2015-11-27 DIAGNOSIS — A499 Bacterial infection, unspecified: Secondary | ICD-10-CM | POA: Diagnosis not present

## 2015-11-27 MED ORDER — LEVOFLOXACIN 500 MG PO TABS: 500.0000 mg | ORAL_TABLET | Freq: Every day | ORAL | Status: DC

## 2015-11-27 NOTE — Progress Notes (Signed)
CC: Gwendolyn Bowen is a 35 y.o. female is here for Sinusitis and Migraine   Subjective: HPI:  2 weeks of facial pressure, nasal congestion, sore throat and nonproductive cough. No interventions as of yet. Denies fevers, chills, confusion or abdominal pain. Denies shortness of breath, wheezing or productive cough. Symptoms are moderate in severity and present all hours of the day. Nothing seems to make symptoms better or worse  Genetic parenting migraines multiple times a week. She was recently fired because she missed so many days of work due to her migraines. She describes this as a pounding right-sided headache with photophobia and nausea. She's tried multiple injections and pills but nothing seems to help this. She denies any new motor or sensory disturbances other than that described above     Review Of Systems Outlined In HPI  Past Medical History  Diagnosis Date  . Depression   . Arthritis     Past Surgical History  Procedure Laterality Date  . Dilatation & curettage/hysteroscopy with trueclear     Family History  Problem Relation Age of Onset  . Hypertension Mother   . Rheum arthritis Mother   . Cancer Mother     breast  . Clotting disorder Mother   . Bell's palsy Mother     Social History   Social History  . Marital Status: Married    Spouse Name: N/A  . Number of Children: N/A  . Years of Education: N/A   Occupational History  . Not on file.   Social History Main Topics  . Smoking status: Never Smoker   . Smokeless tobacco: Not on file  . Alcohol Use: No  . Drug Use: No  . Sexual Activity: Yes   Other Topics Concern  . Not on file   Social History Narrative     Objective: BP 110/73 mmHg  Pulse 76  Wt 132 lb (59.875 kg)  LMP 04/27/2014  General: Alert and Oriented, No Acute Distress HEENT: Pupils equal, round, reactive to light. Conjunctivae clear.  External ears unremarkable, canals clear with intact TMs with appropriate landmarks.  Middle ear  appears open without effusion. Pink inferior turbinates.  Moist mucous membranes, pharynx without inflammation nor lesions.  Neck supple without palpable lymphadenopathy nor abnormal masses. Lungs: Clear and comfortable work of breathing frequent coughing Cardiac: Regular rate and rhythm. Extremities: No peripheral edema.  Strong peripheral pulses.  Mental Status: No depression, anxiety, nor agitation. Skin: Warm and dry.  Assessment & Plan: Gwendolyn Bowen was seen today for sinusitis and migraine.  Diagnoses and all orders for this visit:  Bacterial sinusitis -     levofloxacin (LEVAQUIN) 500 MG tablet; Take 1 tablet (500 mg total) by mouth daily.  Migraine without aura and without status migrainosus, not intractable -     Ambulatory referral to Neurology   Bacterial sinusitis: Start levofloxacin consider nasal saline washes Migraines: Uncontrolled chronic condition joint decision for referral to neurology for consideration of Botox injections.  Return if symptoms worsen or fail to improve.

## 2015-12-05 ENCOUNTER — Ambulatory Visit: Payer: 59 | Admitting: Neurology

## 2015-12-05 ENCOUNTER — Telehealth: Payer: Self-pay | Admitting: *Deleted

## 2015-12-05 NOTE — Telephone Encounter (Signed)
no showed new patient appt 

## 2015-12-11 ENCOUNTER — Ambulatory Visit: Payer: 59 | Admitting: Neurology

## 2015-12-11 ENCOUNTER — Encounter: Payer: Self-pay | Admitting: *Deleted

## 2015-12-11 NOTE — Progress Notes (Signed)
No showed new patient appt 

## 2015-12-12 ENCOUNTER — Encounter: Payer: Self-pay | Admitting: Neurology

## 2015-12-28 ENCOUNTER — Other Ambulatory Visit: Payer: Self-pay | Admitting: Family Medicine

## 2016-01-28 DIAGNOSIS — E860 Dehydration: Secondary | ICD-10-CM | POA: Diagnosis not present

## 2016-01-28 DIAGNOSIS — R109 Unspecified abdominal pain: Secondary | ICD-10-CM | POA: Diagnosis not present

## 2016-01-28 DIAGNOSIS — K529 Noninfective gastroenteritis and colitis, unspecified: Secondary | ICD-10-CM | POA: Diagnosis not present

## 2016-01-28 DIAGNOSIS — D72829 Elevated white blood cell count, unspecified: Secondary | ICD-10-CM | POA: Diagnosis not present

## 2016-02-07 ENCOUNTER — Other Ambulatory Visit: Payer: Self-pay | Admitting: Family Medicine

## 2016-03-18 ENCOUNTER — Other Ambulatory Visit: Payer: Self-pay | Admitting: Family Medicine

## 2016-04-07 ENCOUNTER — Ambulatory Visit (INDEPENDENT_AMBULATORY_CARE_PROVIDER_SITE_OTHER): Payer: Self-pay | Admitting: Osteopathic Medicine

## 2016-04-07 ENCOUNTER — Encounter: Payer: Self-pay | Admitting: Osteopathic Medicine

## 2016-04-07 VITALS — BP 106/61 | HR 69 | Ht 63.0 in | Wt 144.0 lb

## 2016-04-07 DIAGNOSIS — F411 Generalized anxiety disorder: Secondary | ICD-10-CM

## 2016-04-07 DIAGNOSIS — Z23 Encounter for immunization: Secondary | ICD-10-CM

## 2016-04-07 DIAGNOSIS — R635 Abnormal weight gain: Secondary | ICD-10-CM

## 2016-04-07 MED ORDER — VENLAFAXINE HCL ER 150 MG PO CP24: 150.0000 mg | ORAL_CAPSULE | Freq: Every day | ORAL | 1 refills | Status: DC

## 2016-04-07 MED ORDER — BUPROPION HCL ER (XL) 150 MG PO TB24: 150.0000 mg | ORAL_TABLET | ORAL | 1 refills | Status: DC

## 2016-04-07 NOTE — Patient Instructions (Addendum)
If doing well on the new medicines, call and we can refill these. We are starting at a lower dose of Wellbutrin and we have the option of going up on this dose.   Will get labs today, expect a call about these results

## 2016-04-07 NOTE — Progress Notes (Signed)
HPI: Gwendolyn Bowen is a 35 y.o. female  who presents to [facility] today, 04/07/16,  for chief complaint of:  Chief Complaint  Patient presents with  . Establish Care     Refill on Effexor and wants to discuss increased anxiety as wellas weight gain. Previous medications: Zoloft and then switched to Effexor since it wasn't working, currently on Effexor XR 150 mg daily. Anxiety is a bit worse, work issues have been a problem, unemployed and uninsured currently, caring for 3 kids, supportive husband.   Weight gain has been an issue since she had partial hysterectomy and L salpingoopharectomy. Hx eating disorder, in recovery and knows how to balance calorie counting with starvation, she is working out diligently with a Psychologist, educational.      Past medical, surgical, social and family history reviewed: Past Medical History:  Diagnosis Date  . Arthritis   . Depression    Past Surgical History:  Procedure Laterality Date  . DILATATION & CURETTAGE/HYSTEROSCOPY WITH TRUECLEAR     Social History  Substance Use Topics  . Smoking status: Never Smoker  . Smokeless tobacco: Never Used  . Alcohol use No   Family History  Problem Relation Age of Onset  . Hypertension Mother   . Rheum arthritis Mother   . Cancer Mother     breast  . Clotting disorder Mother   . Bell's palsy Mother      Current medication list and allergy/intolerance information reviewed:   Current Outpatient Prescriptions  Medication Sig Dispense Refill  . venlafaxine XR (EFFEXOR-XR) 75 MG 24 hr capsule Take 2 capsules (150 mg total) by mouth daily as needed. NEED FOLLOW UP VISIT FOR MORE REFILLS 60 capsule PRN   No current facility-administered medications for this visit.    Allergies  Allergen Reactions  . Codeine Anaphylaxis  . Nsaids Anaphylaxis  . Aleve [Naproxen Sodium]   . Amoxicillin     Tolerates cephalosporins  . Asa [Aspirin]   . Ibuprofen   . Penicillins   . Sulfa  Antibiotics       Review of Systems:  Constitutional:  No  fever, no chills, No recent illness, +unintentional weight changes. No significant fatigue.   HEENT: No  headache, no vision change  Cardiac: No  chest pain, No  pressure  Respiratory:  No  shortness of breath. No  Cough  Gastrointestinal: No  abdominal pain, No  nause   Endocrine: No cold intolerance,  No heat intolerance. No polyuria/polydipsia/polyphagia   Neurologic: No  weakness, No  dizziness  Psychiatric: No  concerns with depression, +concerns with anxiety, No sleep problems, No mood problems  Exam:  BP 106/61   Pulse 69   Ht 5\' 3"  (1.6 m)   Wt 144 lb (65.3 kg)   LMP 04/27/2014   BMI 25.51 kg/m   Constitutional: VS see above. General Appearance: alert, well-developed, well-nourished, NAD  Ears, Nose, Mouth, Throat: MMM,   Neck: No masses, trachea midline  Respiratory: Normal respiratory effort. no wheeze, no rhonchi, no rales  Cardiovascular: S1/S2 normal, no murmur, no rub/gallop auscultated. RRR. No lower extremity edema.   Musculoskeletal: Gait normal.   Neurological: Normal balance/coordination. No tremor.   Skin: warm, dry, intact. No rash/ulcer  Psychiatric: Normal judgment/insight. Normal mood and affect. Oriented x3.   GAD 7 : Generalized Anxiety Score 04/07/2016  Nervous, Anxious, on Edge 3  Control/stop worrying 3  Worry too much - different things 2  Trouble relaxing 3  Restless 2  Easily annoyed or irritable 2  Afraid - awful might happen 0  Total GAD 7 Score 15  Anxiety Difficulty Very difficult    Depression screen PHQ 2/9 04/07/2016  Decreased Interest 2  Down, Depressed, Hopeless 2  PHQ - 2 Score 4  Altered sleeping 3  Tired, decreased energy 3  Change in appetite 3  Feeling bad or failure about yourself  3  Trouble concentrating 2  Moving slowly or fidgety/restless 2  Suicidal thoughts 0  PHQ-9 Score 20     ASSESSMENT/PLAN: If doing well on medications, ok to  continue. Advised on weight loss strategies, Wellbutrin might help with weight as well as anxiety/depression. No insurance at the moment, recommended at least get labs and followup for annual physical when due.   Generalized anxiety disorder - Plan: buPROPion (WELLBUTRIN XL) 150 MG 24 hr tablet, venlafaxine XR (EFFEXOR XR) 150 MG 24 hr capsule  Need for prophylactic vaccination and inoculation against influenza - Plan: Flu Vaccine QUAD 36+ mos IM  Weight gain - Plan: buPROPion (WELLBUTRIN XL) 150 MG 24 hr tablet, CBC with Differential/Platelet, COMPLETE METABOLIC PANEL WITH GFR, TSH   Patient Instructions  If doing well on the new medicines, call and we can refill these. We are starting at a lower dose of Wellbutrin and we have the option of going up on this dose.   Will get labs today, expect a call about these results     Visit summary with medication list and pertinent instructions was printed for patient to review. All questions at time of visit were answered - patient instructed to contact office with any additional concerns. ER/RTC precautions were reviewed with the patient. Follow-up plan: Return if symptoms worsen or fail to improve.

## 2016-04-08 LAB — COMPLETE METABOLIC PANEL WITH GFR
ALT: 13 U/L (ref 6–29)
AST: 17 U/L (ref 10–30)
Albumin: 4.5 g/dL (ref 3.6–5.1)
Alkaline Phosphatase: 48 U/L (ref 33–115)
BUN: 12 mg/dL (ref 7–25)
CALCIUM: 9.3 mg/dL (ref 8.6–10.2)
CHLORIDE: 105 mmol/L (ref 98–110)
CO2: 23 mmol/L (ref 20–31)
CREATININE: 0.68 mg/dL (ref 0.50–1.10)
GFR, Est African American: >89 mL/min (ref >=60)
GFR, Est Non African American: >89 mL/min (ref >=60)
GLUCOSE: 69 mg/dL (ref 65–99)
POTASSIUM: 3.8 mmol/L (ref 3.5–5.3)
SODIUM: 140 mmol/L (ref 135–146)
Total Bilirubin: 0.4 mg/dL (ref 0.2–1.2)
Total Protein: 7.3 g/dL (ref 6.1–8.1)

## 2016-04-08 LAB — CBC WITH DIFFERENTIAL/PLATELET
BASOS PCT: 0 %
Basophils Absolute: 0 cells/uL (ref 0–200)
EOS PCT: 0 %
Eosinophils Absolute: 0 cells/uL — ABNORMAL LOW (ref 15–500)
HEMATOCRIT: 39.7 % (ref 35.0–45.0)
HEMOGLOBIN: 13.8 g/dL (ref 11.7–15.5)
LYMPHS ABS: 2052 {cells}/uL (ref 850–3900)
LYMPHS PCT: 38 %
MCH: 29.7 pg (ref 27.0–33.0)
MCHC: 34.8 g/dL (ref 32.0–36.0)
MCV: 85.6 fL (ref 80.0–100.0)
MONO ABS: 270 {cells}/uL (ref 200–950)
MPV: 9.3 fL (ref 7.5–12.5)
Monocytes Relative: 5 %
Neutro Abs: 3078 cells/uL (ref 1500–7800)
Neutrophils Relative %: 57 %
Platelets: 174 10*3/uL (ref 140–400)
RBC: 4.64 MIL/uL (ref 3.80–5.10)
RDW: 12.7 % (ref 11.0–15.0)
WBC: 5.4 10*3/uL (ref 3.8–10.8)

## 2016-04-08 LAB — TSH: TSH: 2.14 m[IU]/L

## 2016-08-05 ENCOUNTER — Ambulatory Visit (INDEPENDENT_AMBULATORY_CARE_PROVIDER_SITE_OTHER): Payer: Medicaid Other | Admitting: Licensed Clinical Social Worker

## 2016-08-05 ENCOUNTER — Encounter (HOSPITAL_COMMUNITY): Payer: Self-pay | Admitting: Licensed Clinical Social Worker

## 2016-08-05 ENCOUNTER — Encounter (INDEPENDENT_AMBULATORY_CARE_PROVIDER_SITE_OTHER): Payer: Self-pay

## 2016-08-05 DIAGNOSIS — F418 Other specified anxiety disorders: Secondary | ICD-10-CM
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
## 2016-08-12 ENCOUNTER — Ambulatory Visit (HOSPITAL_COMMUNITY): Payer: Self-pay | Admitting: Licensed Clinical Social Worker

## 2016-09-05 ENCOUNTER — Ambulatory Visit: Payer: Self-pay | Admitting: Osteopathic Medicine

## 2016-09-09 ENCOUNTER — Ambulatory Visit (HOSPITAL_COMMUNITY): Payer: Self-pay | Admitting: Licensed Clinical Social Worker

## 2016-09-15 ENCOUNTER — Telehealth: Payer: Self-pay

## 2016-09-15 ENCOUNTER — Ambulatory Visit (INDEPENDENT_AMBULATORY_CARE_PROVIDER_SITE_OTHER): Payer: Medicaid Other

## 2016-09-15 ENCOUNTER — Ambulatory Visit (HOSPITAL_COMMUNITY): Payer: Self-pay | Admitting: Licensed Clinical Social Worker

## 2016-09-15 ENCOUNTER — Ambulatory Visit (INDEPENDENT_AMBULATORY_CARE_PROVIDER_SITE_OTHER): Payer: Self-pay | Admitting: Physician Assistant

## 2016-09-15 VITALS — BP 122/74 | HR 83 | Wt 144.0 lb

## 2016-09-15 DIAGNOSIS — J9811 Atelectasis: Secondary | ICD-10-CM

## 2016-09-15 DIAGNOSIS — J189 Pneumonia, unspecified organism: Secondary | ICD-10-CM | POA: Diagnosis not present

## 2016-09-15 DIAGNOSIS — J069 Acute upper respiratory infection, unspecified: Secondary | ICD-10-CM

## 2016-09-15 DIAGNOSIS — F331 Major depressive disorder, recurrent, moderate: Secondary | ICD-10-CM

## 2016-09-15 DIAGNOSIS — F411 Generalized anxiety disorder: Secondary | ICD-10-CM

## 2016-09-15 DIAGNOSIS — R011 Cardiac murmur, unspecified: Secondary | ICD-10-CM | POA: Insufficient documentation

## 2016-09-15 MED ORDER — VENLAFAXINE HCL ER 150 MG PO CP24: 150.0000 mg | ORAL_CAPSULE | Freq: Every day | ORAL | 1 refills | Status: DC

## 2016-09-15 MED ORDER — LEVOFLOXACIN 750 MG PO TABS: 750.0000 mg | ORAL_TABLET | Freq: Every day | ORAL | 0 refills | Status: DC

## 2016-09-15 MED ORDER — FLONASE 50 MCG/ACT NA SUSP: 1.0000 | Freq: Every day | NASAL | 2 refills | Status: DC

## 2016-09-15 MED ORDER — ALBUTEROL SULFATE HFA 108 (90 BASE) MCG/ACT IN AERS: 1.0000 | INHALATION_SPRAY | RESPIRATORY_TRACT | 0 refills | Status: DC | PRN

## 2016-09-15 MED ORDER — IPRATROPIUM-ALBUTEROL 0.5-2.5 (3) MG/3ML IN SOLN
3.0000 mL | Freq: Once | RESPIRATORY_TRACT | Status: AC
  Administered 2016-09-15: 3 mL via RESPIRATORY_TRACT

## 2016-09-15 NOTE — Telephone Encounter (Signed)
Looks like [facility] sent a refill on this already today. If patient doing well on this medication, okay to refill for 6 months next time she is due.

## 2016-09-15 NOTE — Progress Notes (Signed)
HPI:                                                                Gwendolyn RadarSally R Haan is a 36 y.o. female who [facility] today for sinus symptoms  Sinusitis  This is a new problem. The current episode started 1 to 4 weeks ago (1 week ago). The problem has been gradually worsening since onset. There has been no fever. The pain is mild. Associated symptoms include congestion, coughing, a hoarse voice, shortness of breath, sinus pressure and sneezing. Treatments tried: Mucinex, antihistamine. The treatment provided no relief.  Patient is a nonsmoker. Denies history of asthma or pulmonary disease. She states Azithromycin does not work for her.  Patient also requesting refill of her Effexor. She has been out of her medication for 2 days. She states she was prescribed Wellbutrin to also help with weight loss, but she has self-discontinued this. Denies symptoms of mania/hypomania. Denies suicidal thinking. Denies auditory/visual hallucinations.   Past Medical History:  Diagnosis Date  . Arthritis   . Depression    Past Surgical History:  Procedure Laterality Date  . DILATATION & CURETTAGE/HYSTEROSCOPY WITH TRUECLEAR     Social History  Substance Use Topics  . Smoking status: Never Smoker  . Smokeless tobacco: Never Used  . Alcohol use No   family history includes Bell's palsy in her mother; Cancer in her mother; Clotting disorder in her mother; Hypertension in her mother; Rheum arthritis in her mother.  ROS: negative except as noted in the HPI  Medications: Current Outpatient Prescriptions  Medication Sig Dispense Refill  . venlafaxine XR (EFFEXOR XR) 150 MG 24 hr capsule Take 1 capsule (150 mg total) by mouth daily with breakfast. 90 capsule 1  . buPROPion (WELLBUTRIN XL) 150 MG 24 hr tablet Take 1 tablet (150 mg total) by mouth every morning. (Patient not taking: Reported on 09/15/2016) 30 tablet 1  . venlafaxine XR  (EFFEXOR-XR) 75 MG 24 hr capsule Take 2 capsules (150 mg total) by mouth daily as needed. NEED FOLLOW UP VISIT FOR MORE REFILLS (Patient not taking: Reported on 09/15/2016) 60 capsule PRN   No current facility-administered medications for this visit.    Allergies  Allergen Reactions  . Codeine Anaphylaxis  . Nsaids Anaphylaxis  . Aleve [Naproxen Sodium]   . Amoxicillin     Tolerates cephalosporins  . Asa [Aspirin]   . Ibuprofen   . Penicillins   . Sulfa Antibiotics        Objective:  BP 122/74   Pulse 83   Wt 144 lb (65.3 kg)   LMP 04/27/2014   BMI 25.51 kg/m  Gen: well-groomed, cooperative, ill-appearing, not toxic-appearing, no distress HEENT: normal conjunctiva, TM's clear, nasal mucosa edematous, oropharynx clear, moist mucus membranes, there is maxillary sinus tenderness bilaterally Pulm: Normal work of breathing, normal phonation, clear to auscultation bilaterally, diffuse wheezes bilaterally CV: Normal rate, regular rhythm, s1 and s2 distinct, systolic ejection murmur heard best at the left upper sternal border accentuated by valsava, no carotid bruit Neuro: alert and oriented x 3, EOM's intact Lymph: there is posterior cervical adenopathy, no supraclavicular adenopathy Skin: warm and dry, no rashes or lesions on exposed skin, no cyanosis Psych: good eye contact,  depressed mood, normal speech, organized thought content  Depression screen [facility] 2/9 09/15/2016 04/07/2016  Decreased Interest 1 2  Down, Depressed, Hopeless 3 2  PHQ - 2 Score 4 4  Altered sleeping 3 3  Tired, decreased energy 3 3  Change in appetite 2 3  Feeling bad or failure about yourself  2 3  Trouble concentrating 0 2  Moving slowly or fidgety/restless 0 2  Suicidal thoughts 0 0  PHQ-9 Score 14 20   GAD 7 : Generalized Anxiety Score 09/15/2016 04/07/2016  Nervous, Anxious, on Edge 3 3  Control/stop worrying 3 3  Worry too much - different things 3 2  Trouble relaxing 1 3  Restless 1 2  Easily  annoyed or irritable 2 2  Afraid - awful might happen 0 0  Total GAD 7 Score 13 15  Anxiety Difficulty - Very difficult      Assessment and Plan: 36 y.o. female with   1. Generalized anxiety disorder, moderate episode of MDD - refilled Effexor 150mg  - follow-up with PCP in 4 weeks  2. Acute upper respiratory infection of multiple sites - wheezing on exam today. No respiratory distress. Duoneb given in clinic today. - DG Chest 2 View - levofloxacin (LEVAQUIN) 750 MG tablet; Take 1 tablet (750 mg total) by mouth daily.  Dispense: 5 tablet; Refill: 0 - albuterol (PROVENTIL HFA;VENTOLIN HFA) 108 (90 Base) MCG/ACT inhaler; Inhale 1 puff into the lungs every 4 (four) hours as needed for wheezing.  Dispense: 1 Inhaler; Refill: 0 - FLONASE 50 MCG/ACT nasal spray; Place 1 spray into both nostrils daily.  Dispense: 0.5 g; Refill: 2 - ipratropium-albuterol (DUONEB) 0.5-2.5 (3) MG/3ML nebulizer solution 3 mL; Take 3 mLs by nebulization once. - predniSONE (DELTASONE) 50 MG tablet; One tab PO daily for 5 days.  Dispense: 5 tablet; Refill: 0 - recommended follow-up spirometry in 4-6 weeks when patient is no longer acutely ill  3. Systolic ejection murmur - patient states she has never been told she has a murmur. It is not documented in her chart. - ECHOCARDIOGRAM COMPLETE; Future  Patient education and anticipatory guidance given Patient agrees with treatment plan Follow-up in 1 week or sooner as needed   Levonne Hubert PA-C

## 2016-09-15 NOTE — Telephone Encounter (Signed)
Pt has lost her prescription of effexor, she just had it refilled about 2 weeks ago.  She is asking how to go about getting another.  Please advise.

## 2016-09-15 NOTE — Patient Instructions (Addendum)
For your respiratory infection: Go downstairs for chest xray Take antibiotic once a day for 5 days Use Albuterol every 6 hours as needed for wheezing or shortness of breath Use nasal spray - 1 spray each nostril daily DO NOT SNIFF HARD Aim away from nasal septum Gently inhale When you are feeling better, schedule spirometry (lung function testing) - I would say in about 2-3 weeks   For your murmur: I have ordered an echocardiogram (ultrasound of your heart) NO EXERCISE UNTIL YOU HAVE HAD THIS PERFORMED AND RECEIVED MEDICAL CLEARANCE  For your anxiety: Re-start your Effexor daily It may take a couple of days for withdrawal symptoms to go away Continue to follow-up with psychiatry/counseling Follow-up in 1 month   Living With Anxiety After being diagnosed with an anxiety disorder, you may be relieved to know why you have felt or behaved a certain way. It is natural to also feel overwhelmed about the treatment ahead and what it will mean for your life. With care and support, you can manage this condition and recover from it. How to cope with anxiety Dealing with stress  Stress is your body's reaction to life changes and events, both good and bad. Stress can last just a few hours or it can be ongoing. Stress can play a major role in anxiety, so it is important to learn both how to cope with stress and how to think about it differently. Talk with your health care provider or a counselor to learn more about stress reduction. He or she may suggest some stress reduction techniques, such as:  Music therapy. This can include creating or listening to music that you enjoy and that inspires you.  Mindfulness-based meditation. This involves being aware of your normal breaths, rather than trying to control your breathing. It can be done while sitting or walking.  Centering prayer. This is a kind of meditation that involves focusing on a word, phrase, or sacred image that is meaningful to you and  that brings you peace.  Deep breathing. To do this, expand your stomach and inhale slowly through your nose. Hold your breath for 3-5 seconds. Then exhale slowly, allowing your stomach muscles to relax.  Self-talk. This is a skill where you identify thought patterns that lead to anxiety reactions and correct those thoughts.  Muscle relaxation. This involves tensing muscles then relaxing them. Choose a stress reduction technique that fits your lifestyle and personality. Stress reduction techniques take time and practice. Set aside 5-15 minutes a day to do them. Therapists can offer training in these techniques. The training may be covered by some insurance plans. Other things you can do to manage stress include:  Keeping a stress diary. This can help you learn what triggers your stress and ways to control your response.  Thinking about how you respond to certain situations. You may not be able to control everything, but you can control your reaction.  Making time for activities that help you relax, and not feeling guilty about spending your time in this way. Therapy combined with coping and stress-reduction skills provides the best chance for successful treatment. Medicines  Medicines can help ease symptoms. Medicines for anxiety include:  Anti-anxiety drugs.  Antidepressants.  Beta-blockers. Medicines may be used as the main treatment for anxiety disorder, along with therapy, or if other treatments are not working. Medicines should be prescribed by a health care provider. Relationships  Relationships can play a big part in helping you recover. Try to spend more time connecting  with trusted friends and family members. Consider going to couples counseling, taking family education classes, or going to family therapy. Therapy can help you and others better understand the condition. How to recognize changes in your condition Everyone has a different response to treatment for anxiety. Recovery  from anxiety happens when symptoms decrease and stop interfering with your daily activities at home or work. This may mean that you will start to:  Have better concentration and focus.  Sleep better.  Be less irritable.  Have more energy.  Have improved memory. It is important to recognize when your condition is getting worse. Contact your health care provider if your symptoms interfere with home or work and you do not feel like your condition is improving. Where to find help and support: You can get help and support from these sources:  Self-help groups.  Online and [employer].  A trusted spiritual leader.  Couples counseling.  Family education classes.  Family therapy. Follow these instructions at home:  Eat a healthy diet that includes plenty of vegetables, fruits, whole grains, low-fat dairy products, and lean protein. Do not eat a lot of foods that are high in solid fats, added sugars, or salt.  Exercise. Most adults should do the following:  Exercise for at least 150 minutes each week. The exercise should increase your heart rate and make you sweat (moderate-intensity exercise).  Strengthening exercises at least twice a week.  Cut down on caffeine, tobacco, alcohol, and other potentially harmful substances.  Get the right amount and quality of sleep. Most adults need 7-9 hours of sleep each night.  Make choices that simplify your life.  Take over-the-counter and prescription medicines only as told by your health care provider.  Avoid caffeine, alcohol, and certain over-the-counter cold medicines. These may make you feel worse. Ask your pharmacist which medicines to avoid.  Keep all follow-up visits as told by your health care provider. This is important. Questions to ask your health care provider  Would I benefit from therapy?  How often should I follow up with a health care provider?  How long do I need to take medicine?  Are there any  long-term side effects of my medicine?  Are there any alternatives to taking medicine? Contact a health care provider if:  You have a hard time staying focused or finishing daily tasks.  You spend many hours a day feeling worried about everyday life.  You become exhausted by worry.  You start to have headaches, feel tense, or have nausea.  You urinate more than normal.  You have diarrhea. Get help right away if:  You have a racing heart and shortness of breath.  You have thoughts of hurting yourself or others. If you ever feel like you may hurt yourself or others, or have thoughts about taking your own life, get help right away. You can go to your nearest emergency department or call:  Your local emergency services (911 in the [place]  A suicide crisis helpline, such as the National Suicide Prevention Lifeline at 786-456-4575. This is open 24-hours a day. Summary  Taking steps to deal with stress can help calm you.  Medicines cannot cure anxiety disorders, but they can help ease symptoms.  Family, friends, and partners can play a big part in helping you recover from an anxiety disorder. This information is not intended to replace advice given to you by your health care provider. Make sure you discuss any questions you have with your health care provider.  Document Released: 06/03/2016 Document Revised: 06/03/2016 Document Reviewed: 06/03/2016 Elsevier Interactive Patient Education  2017 [employer].

## 2016-09-16 MED ORDER — PREDNISONE 50 MG PO TABS: ORAL_TABLET | ORAL | 0 refills | Status: DC

## 2016-09-17 ENCOUNTER — Encounter: Payer: Self-pay | Admitting: Physician Assistant

## 2016-09-17 ENCOUNTER — Ambulatory Visit (HOSPITAL_BASED_OUTPATIENT_CLINIC_OR_DEPARTMENT_OTHER)
Admission: RE | Admit: 2016-09-17 | Discharge: 2016-09-17 | Disposition: A | Discharge status: Home / Self Care | Payer: Medicaid Other | Source: Ambulatory Visit | Attending: Physician Assistant | Admitting: Physician Assistant

## 2016-09-17 ENCOUNTER — Telehealth: Payer: Self-pay

## 2016-09-17 ENCOUNTER — Other Ambulatory Visit: Payer: Self-pay | Admitting: Physician Assistant

## 2016-09-17 DIAGNOSIS — R012 Other cardiac sounds: Secondary | ICD-10-CM | POA: Diagnosis present

## 2016-09-17 DIAGNOSIS — F331 Major depressive disorder, recurrent, moderate: Secondary | ICD-10-CM | POA: Insufficient documentation

## 2016-09-17 DIAGNOSIS — R011 Cardiac murmur, unspecified: Secondary | ICD-10-CM | POA: Insufficient documentation

## 2016-09-17 NOTE — Telephone Encounter (Signed)
Work note written and ready for pick-up

## 2016-09-17 NOTE — Telephone Encounter (Signed)
Pt notified -EH/RMA  

## 2016-09-17 NOTE — Telephone Encounter (Signed)
Pt notified of recent x-ray results. She is asking for a work note for Monday.  She is also wanting the work note to be extended til the end of the week.  But she did go to work yesterday. Please advise. -EH/RMA

## 2016-09-17 NOTE — Progress Notes (Signed)
  Echocardiogram 2D Echocardiogram has been performed.  Arvil ChacoFoster, Jaspal Pultz 09/17/2016, 10:48 AM

## 2016-09-22 ENCOUNTER — Ambulatory Visit (INDEPENDENT_AMBULATORY_CARE_PROVIDER_SITE_OTHER): Payer: PRIVATE HEALTH INSURANCE | Admitting: Licensed Clinical Social Worker

## 2016-09-22 DIAGNOSIS — F418 Other specified anxiety disorders: Secondary | ICD-10-CM

## 2016-09-25 ENCOUNTER — Encounter (HOSPITAL_COMMUNITY): Payer: Self-pay | Admitting: Licensed Clinical Social Worker

## 2016-09-25 NOTE — Progress Notes (Signed)
   THERAPIST PROGRESS NOTE  Session Time: 4:10-5pm  Participation Level: Active  Behavioral Response: NeatAlertEuthymic  Type of Therapy: Individual Therapy  Treatment Goals addressed: Coping  Interventions: CBT  Summary: Gwendolyn Bowen is a 36 y.o. female. Today is the first day of therapy. Mainly worked on developing a trusting therapeutic relationship. Pt was open to working on trust. Pt was open to share her current stressors: separation, raising 3 children, boundaries with mother and sister, new job. Pt is in the middle of many transitions and wavers with her emotions, crying 1 minute and happy the next. Pt has contentious relationship with her sister; she wants a better relationship. She wants a different relationship with her mother. She doesn't feel her mother loves her. Pt feels she has no emotional support, she shared tearfully. Processed with pt relationship circles where she decides who is in her intimate circle or not.Pt verbalized her understanding of boundaries. Pt is in a transition phase of her life but is willing to work in therapy.  Suicidal/Homicidal: Nowithout intent/plan  Therapist Response: Evaluated patient's progress for baseline. Assisted pt with processing relationships, boundaries, transition. Assisted pt in developing a trustful therapeutic relationship.  Plan: Return again in 3 weeks.  Diagnosis: Axis I: Depression with anxiety      MACKENZIE,LISBETH S, LCAS 09/25/2016

## 2016-10-13 ENCOUNTER — Ambulatory Visit: Payer: Medicaid Other | Admitting: Physician Assistant

## 2016-10-20 ENCOUNTER — Ambulatory Visit (INDEPENDENT_AMBULATORY_CARE_PROVIDER_SITE_OTHER): Payer: Medicaid Other | Admitting: Licensed Clinical Social Worker

## 2016-10-20 ENCOUNTER — Ambulatory Visit (HOSPITAL_COMMUNITY): Payer: Self-pay | Admitting: Licensed Clinical Social Worker

## 2016-10-20 DIAGNOSIS — F418 Other specified anxiety disorders: Secondary | ICD-10-CM

## 2016-10-22 ENCOUNTER — Encounter (HOSPITAL_COMMUNITY): Payer: Self-pay | Admitting: Licensed Clinical Social Worker

## 2016-10-22 NOTE — Progress Notes (Signed)
   THERAPIST PROGRESS NOTE  Session Time: 4:10-5pm  Participation Level: Active  Behavioral Response: NeatAlertEuthymic  Type of Therapy: Individual Therapy  Treatment Goals addressed: Coping  Interventions: CBT  Summary: Gwendolyn Bowen is a 36 y.o. female. Today is the first day of therapy.Pt discussed her psychiatric symptoms and current life events. Discussed with pt her healthy coping skills she is using during the current life events. Validated pt's feelings surrounding her impending divorce and custody case. Asked open ended questions about her children's emotional stability going through the divorce and new custody agreement. Processed with pt the importance of self care during this transition. Showed pt grounding technique to help reorient pt to the here and now and to help manage some of her overwhelming feelings and anxiety.   Suicidal/Homicidal: Nowithout intent/plan  Therapist Response: Assessed pt's current functioning and reviewed progress.  Assisted pt with processing relationships, boundaries, transition and processing for the management of her stressors.  Plan: Return again in 3 weeks.  Diagnosis: Axis I: Depression with anxiety      MACKENZIE,LISBETH S, LCAS 10/20/16

## 2016-11-05 ENCOUNTER — Ambulatory Visit (INDEPENDENT_AMBULATORY_CARE_PROVIDER_SITE_OTHER): Payer: Medicaid Other | Admitting: Licensed Clinical Social Worker

## 2016-11-05 ENCOUNTER — Encounter (HOSPITAL_COMMUNITY): Payer: Self-pay | Admitting: Licensed Clinical Social Worker

## 2016-11-05 DIAGNOSIS — F418 Other specified anxiety disorders: Secondary | ICD-10-CM | POA: Diagnosis not present

## 2016-11-05 NOTE — Progress Notes (Signed)
   THERAPIST PROGRESS NOTE  Session Time: 2:10-3pm  Participation Level: Active  Behavioral Response: NeatAlertEuthymic  Type of Therapy: Individual Therapy  Treatment Goals addressed: Coping  Interventions: CBT  Summary: Gwendolyn Bowen is a 36 y.o. female.Pt discussed her psychiatric symptoms and current life events. Pt has been hired to full time at her job. This will assist with pt have less stress. This will help pt in meeting her obligations for herself and her children. Asked open-ended questions about her and the children's transition through the divorce. Worked with pt on her problem-solving skills on budgeting. Taught pt mindfulness skills and practiced grounding with the 5 senses. Pt was receptive to grounding techniques.     Suicidal/Homicidal: Nowithout intent/plan  Therapist Response: Assessed pt's current functioning and reviewed progress.  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 and transition and processing for the management of her stressors.  Plan: Return again in 3 weeks. Discuss basic CBT skills.  Diagnosis: Axis I: Depression with anxiety      Sircharles Holzheimer S, LCAS 11/06/15

## 2016-11-27 ENCOUNTER — Ambulatory Visit (HOSPITAL_COMMUNITY): Payer: Self-pay | Admitting: Licensed Clinical Social Worker

## 2016-12-05 ENCOUNTER — Other Ambulatory Visit: Payer: Self-pay | Admitting: Physician Assistant

## 2016-12-05 DIAGNOSIS — F411 Generalized anxiety disorder: Secondary | ICD-10-CM

## 2016-12-08 ENCOUNTER — Ambulatory Visit (HOSPITAL_COMMUNITY): Payer: Self-pay | Admitting: Licensed Clinical Social Worker

## 2016-12-08 ENCOUNTER — Telehealth: Payer: Self-pay | Admitting: Osteopathic Medicine

## 2016-12-08 NOTE — Telephone Encounter (Signed)
Okay with me. Looks like she saw Gwendolyn BergamoCharley back in March for an acute visit

## 2016-12-08 NOTE — Telephone Encounter (Signed)
Pt called. She wants to switch to [place].  Is this okay with you all?

## 2016-12-08 NOTE — Telephone Encounter (Signed)
Okay with me 

## 2016-12-09 ENCOUNTER — Ambulatory Visit (INDEPENDENT_AMBULATORY_CARE_PROVIDER_SITE_OTHER): Payer: Medicaid Other | Admitting: Physician Assistant

## 2016-12-09 VITALS — BP 110/74 | HR 78 | Wt 154.0 lb

## 2016-12-09 DIAGNOSIS — R635 Abnormal weight gain: Secondary | ICD-10-CM

## 2016-12-09 DIAGNOSIS — F411 Generalized anxiety disorder: Secondary | ICD-10-CM

## 2016-12-09 DIAGNOSIS — F5105 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 disorder: Secondary | ICD-10-CM | POA: Diagnosis not present

## 2016-12-09 DIAGNOSIS — F99 Mental disorder, not otherwise specified: Secondary | ICD-10-CM

## 2016-12-09 DIAGNOSIS — F3341 Major depressive disorder, recurrent, in partial remission: Secondary | ICD-10-CM | POA: Diagnosis not present

## 2016-12-09 MED ORDER — VENLAFAXINE HCL ER 37.5 MG PO CP24: ORAL_CAPSULE | ORAL | 0 refills | Status: DC

## 2016-12-09 MED ORDER — ZOLPIDEM TARTRATE 5 MG PO TABS: 5.0000 mg | ORAL_TABLET | Freq: Every evening | ORAL | 0 refills | Status: DC | PRN

## 2016-12-09 MED ORDER — ESCITALOPRAM OXALATE 20 MG PO TABS: ORAL_TABLET | ORAL | 1 refills | Status: DC

## 2016-12-09 NOTE — Progress Notes (Signed)
HPI:                                                                Gwendolyn RadarSally R Wojahn is a 36 y.o. female [facility] today for anxiety/depression follow-up  Depression/Anxiety: taking Effexor 150mg  daily for approximately 1 year. Reports fatigue and nightsweats that she attributes to the medication. She would like to switch to a different medication.  She reports she was on Zoloft for 8-9 years prior. She has failed Celexa, Cymbalta, and Wellbutrin. She is not certain of any side effects to these medications. Denies symptoms of mania/hypomania. Denies suicidal thinking. Denies auditory/visual hallucinations.  Insomnia: patient is requesting to re-start Ambien. She states she is only getting 3-5 hours of sleep and does not feel rested.   Abnormal Weight Gain: followed by Dr. Marchia Bondoe. States she has been doing monthly injections and cannot lose weight. She is upset because she cannot fit into her summer clothes. She states she is eating gluten free, protein, and vegetables and cooking at home. She exercises 3 days a week and walks daily. She has had lab work checked by myself and her GYN and there is no thyroid dysfunction or abnormalities.   Past Medical History:  Diagnosis Date  . Arthritis   . Depression   . Heart murmur   . Migraines    Past Surgical History:  Procedure Laterality Date  . DILATATION & CURETTAGE/HYSTEROSCOPY WITH TRUECLEAR     Social History  Substance Use Topics  . Smoking status: Never Smoker  . Smokeless tobacco: Never Used  . Alcohol use No   family history includes Bell's palsy in her mother; Cancer in her mother; Clotting disorder in her mother; Hypertension in her mother; Rheum arthritis in her mother.  ROS: negative except as noted in the HPI  Medications: Current Outpatient Prescriptions  Medication Sig Dispense Refill  . albuterol (PROVENTIL HFA;VENTOLIN HFA) 108 (90 Base) MCG/ACT inhaler  Inhale 1 puff into the lungs every 4 (four) hours as needed for wheezing. 1 Inhaler 0  . escitalopram (LEXAPRO) 20 MG tablet One half tab daily for a week then one tab by mouth daily 30 tablet 1  . FLONASE 50 MCG/ACT nasal spray Place 1 spray into both nostrils daily. 0.5 g 2  . venlafaxine XR (EFFEXOR XR) 37.5 MG 24 hr capsule Two tabs daily for 1 week, then 1 tab daily for 1 week 21 capsule 0  . zolpidem (AMBIEN) 5 MG tablet Take 1 tablet (5 mg total) by mouth at bedtime as needed for sleep. 30 tablet 0   No current facility-administered medications for this visit.    Allergies  Allergen Reactions  . Codeine Anaphylaxis  . Nsaids Anaphylaxis  . Aleve [Naproxen Sodium]   . Amoxicillin     Tolerates cephalosporins  . Asa [Aspirin]   . Ibuprofen   . Penicillins   . Sulfa Antibiotics        Objective:  BP 110/74   Pulse 78   Wt 154 lb (69.9 kg)   LMP 04/27/2014   BMI 27.28 kg/m  Gen: well-groomed, cooperative, not ill-appearing, no distress Pulm: Normal work of breathing, normal phonation Neuro: alert and oriented x 3, EOM's intact, no tremor MSK: moving  all extremities, normal gait and station, no peripheral edema Psych: good eye contact, normal affect, euthymic mood, normal speech and thought content  Depression screen [facility] 2/9 12/09/2016 09/15/2016 04/07/2016  Decreased Interest 0 1 2  Down, Depressed, Hopeless 1 3 2   PHQ - 2 Score 1 4 4   Altered sleeping 3 3 3   Tired, decreased energy 3 3 3   Change in appetite 3 2 3   Feeling bad or failure about yourself  0 2 3  Trouble concentrating 2 0 2  Moving slowly or fidgety/restless 2 0 2  Suicidal thoughts 0 0 0  PHQ-9 Score 14 14 20    GAD 7 : Generalized Anxiety Score 12/09/2016 09/15/2016 04/07/2016  Nervous, Anxious, on Edge 1 3 3   Control/stop worrying 0 3 3  Worry too much - different things 1 3 2   Trouble relaxing 1 1 3   Restless 2 1 2   Easily annoyed or irritable 1 2 2   Afraid - awful might happen 0 0 0  Total  GAD 7 Score 6 13 15   Anxiety Difficulty - - Very difficult      No results found for this or any previous visit (from the past 72 hour(s)). No results found.    Assessment and Plan: 36 y.o. female with   1. Recurrent major depressive disorder, in partial remission (HCC) - PHQ2 score 1, GAD7 score 6 - improved from 13 - Effexor appears to be controlling patients symptoms, but unfortunately she is experiencing medication side effects. Patient would be a great candidate for Viibryd or Trintellix due to multiple medication failures, but she has Medicaid. Plan to taper off Effexor while titrating on Lexapro - escitalopram (LEXAPRO) 20 MG tablet; One half tab daily for a week then one tab by mouth daily  Dispense: 30 tablet; Refill: 1 - venlafaxine XR (EFFEXOR XR) 37.5 MG 24 hr capsule; Two tabs daily for 1 week, then 1 tab daily for 1 week  Dispense: 21 capsule; Refill: 0  2. Generalized anxiety disorder - plan as above  3. 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 disorder - discussed short-term use of Ambien for 1-3 months - discussed importance of sleep hygiene and regular exercise - zolpidem (AMBIEN) 5 MG tablet; Take 1 tablet (5 mg total) by mouth at bedtime as needed for sleep.  Dispense: 30 tablet; Refill: 0  4. Abnormal weight gain - discussed that given insomnia and GAD, phentermine would be contraindicated at this time - other medical options are not going to be affordable for her - encouraged her to use a calorie tracking app and continue regular exercise  Patient education and anticipatory guidance given Patient agrees with treatment plan Follow-up in 4 weeks for GAD7/PHQ9 or sooner as needed if symptoms worsen or fail to improve  Levonne Hubert PA-C

## 2016-12-09 NOTE — Patient Instructions (Addendum)
For mood: - We are going to taper you down from the Effexor. Take 75mg  (2 x 37.5mg  capsules) for 1 week, then 37.5mg  for 1 week, then stop - At the same time, start Escitalopram/Lexapro. Take 1/2 tablet every morning for a week. Then take the whole tablet.  - Take Ambien immediately at bedtime, preferably while in bed. Do NOT take with food (will delay action). Make sure to give yourself at least 8 hours before you need to get up to avoid daytime sleepiness - Continue to get regular exercise - Practice good sleep hygiene - Follow-up in 4 weeks or sooner as needed  Sleep Hygiene . Limiting daytime naps to 30 minutes . Napping does not make up for inadequate nighttime sleep. However, a short nap of 20-30 minutes can help to improve mood, alertness and performance.  . Avoiding stimulants such as  caffeine and nicotine close to bedtime.  And when it comes to alcohol, moderation is key 4. While alcohol is well-known to help you fall asleep faster, too much close to bedtime can disrupt sleep in the second half of the night as the body begins to process the alcohol.    . Exercising to promote good quality sleep.  As little as 10 minutes of aerobic exercise, such as walking or cycling, can drastically improve nighttime sleep quality.  For the best night's sleep, most people should avoid strenuous workouts close to bedtime. However, the effect of intense nighttime exercise on sleep differs from person to person, so find out what works best for you.   . Steering clear of food that can be disruptive right before sleep.   Heavy or rich foods, fatty or fried meals, spicy dishes, citrus fruits, and carbonated drinks can trigger indigestion for some people. When this occurs close to bedtime, it can lead to painful heartburn that disrupts sleep. . Ensuring adequate exposure to natural light.  This is particularly important for individuals who may not venture outside frequently. Exposure to sunlight during the day, as  well as darkness at night, helps to maintain a healthy sleep-wake cycle . Marland Kitchen Establishing a regular relaxing bedtime routine.  A regular nightly routine helps the body recognize that it is bedtime. This could include taking warm shower or bath, reading a book, or light stretches. When possible, try to avoid emotionally upsetting conversations and activities before attempting to sleep. . Making sure that the sleep environment is pleasant.  Mattress and pillows should be comfortable. The bedroom should be cool - between 60 and 67 degrees - for optimal sleep. Bright light from lamps, cell phone and TV screens can make it difficult to fall asleep4, so turn those light off or adjust them when possible. Consider using blackout curtains, eye shades, ear plugs, "white noise" machines, humidifiers, fans and other devices that can make the bedroom more relaxing.   Living With Depression Everyone experiences occasional disappointment, sadness, and loss in their lives. When you are feeling down, blue, or sad for at least 2 weeks in a row, it may mean that you have depression. Depression can affect your thoughts and feelings, relationships, daily activities, and physical health. It is caused by changes in the way your brain functions. If you receive a diagnosis of depression, your health care provider will tell you which type of depression you have and what treatment options are available to you. If you are living with depression, there are ways to help you recover from it and also ways to prevent it from coming  back. How to cope with lifestyle changes Coping with stress Stress is your body's reaction to life changes and events, both good and bad. Stressful situations may include:  Getting married.  The death of a spouse.  Losing a job.  Retiring.  Having a baby.  Stress can last just a few hours or it can be ongoing. Stress can play a major role in depression, so it is important to learn both how to cope  with stress and how to think about it differently. Talk with your health care provider or a counselor if you would like to learn more about stress reduction. He or she may suggest some stress reduction techniques, such as:  Music therapy. This can include creating music or listening to music. Choose music that you enjoy and that inspires you.  Mindfulness-based meditation. This kind of meditation can be done while sitting or walking. It involves being aware of your normal breaths, rather than trying to control your breathing.  Centering prayer. This is a kind of meditation that involves focusing on a spiritual word or phrase. Choose a word, phrase, or sacred image that is meaningful to you and that brings you peace.  Deep breathing. To do this, expand your stomach and inhale slowly through your nose. Hold your breath for 3-5 seconds, then exhale slowly, allowing your stomach muscles to relax.  Muscle relaxation. This involves intentionally tensing muscles then relaxing them.  Choose a stress reduction technique that fits your lifestyle and personality. Stress reduction techniques take time and practice to develop. Set aside 5-15 minutes a day to do them. Therapists can offer training in these techniques. The training may be covered by some insurance plans. Other things you can do to manage stress include:  Keeping a stress diary. This can help you learn what triggers your stress and ways to control your response.  Understanding what your limits are and saying no to requests or events that lead to a schedule that is too full.  Thinking about how you respond to certain situations. You may not be able to control everything, but you can control how you react.  Adding humor to your life by watching funny films or TV shows.  Making time for activities that help you relax and not feeling guilty about spending your time this way.  Medicines Your health care provider may suggest certain medicines if  he or she feels that they will help improve your condition. Avoid using alcohol and other substances that may prevent your medicines from working properly (may interact). It is also important to:  Talk with your pharmacist or health care provider about all the medicines that you take, their possible side effects, and what medicines are safe to take together.  Make it your goal to take part in all treatment decisions (shared decision-making). This includes giving input on the side effects of medicines. It is best if shared decision-making with your health care provider is part of your total treatment plan.  If your health care provider prescribes a medicine, you may not notice the full benefits of it for 4-8 weeks. Most people who are treated for depression need to be on medicine for at least 6-12 months after they feel better. If you are taking medicines as part of your treatment, do not stop taking medicines without first talking to your health care provider. You may need to have the medicine slowly decreased (tapered) over time to decrease the risk of harmful side effects. Relationships Your health care  provider may suggest family therapy along with individual therapy and drug therapy. While there may not be family problems that are causing you to feel depressed, it is still important to make sure your family learns as much as they can about your mental health. Having your family's support can help make your treatment successful. How to recognize changes in your condition Everyone has a different response to treatment for depression. Recovery from major depression happens when you have not had signs of major depression for two months. This may mean that you will start to:  Have more interest in doing activities.  Feel less hopeless than you did 2 months ago.  Have more energy.  Overeat less often, or have better or improving appetite.  Have better concentration.  Your health care provider  will work with you to decide the next steps in your recovery. It is also important to recognize when your condition is getting worse. Watch for these signs:  Having fatigue or low energy.  Eating too much or too little.  Sleeping too much or too little.  Feeling restless, agitated, or hopeless.  Having trouble concentrating or making decisions.  Having unexplained physical complaints.  Feeling irritable, angry, or aggressive.  Get help as soon as you or your family members notice these symptoms coming back. How to get support and help from others How to talk with friends and family members about your condition Talking to friends and family members about your condition can provide you with one way to get support and guidance. Reach out to trusted friends or family members, explain your symptoms to them, and let them know that you are working with a health care provider to treat your depression. Financial resources Not all insurance plans cover mental health care, so it is important to check with your insurance carrier. If paying for co-pays or counseling services is a problem, search for a local or county mental health care center. They may be able to offer public mental health care services at low or no cost when you are not able to see a private health care provider. If you are taking medicine for depression, you may be able to get the generic form, which may be less expensive. Some makers of prescription medicines also offer help to patients who cannot afford the medicines they need. Follow these instructions at home:  Get the right amount and quality of sleep.  Cut down on using caffeine, tobacco, alcohol, and other potentially harmful substances.  Try to exercise, such as walking or lifting small weights.  Take over-the-counter and prescription medicines only as told by your health care provider.  Eat a healthy diet that includes plenty of vegetables, fruits, whole grains,  low-fat dairy products, and lean protein. Do not eat a lot of foods that are high in solid fats, added sugars, or salt.  Keep all follow-up visits as told by your health care provider. This is important. Contact a health care provider if:  You stop taking your antidepressant medicines, and you have any of these symptoms: ? Nausea. ? Headache. ? Feeling lightheaded. ? Chills and body aches. ? Not being able to sleep (insomnia).  You or your friends and family think your depression is getting worse. Get help right away if:  You have thoughts of hurting yourself or others. If you ever feel like you may hurt yourself or others, or have thoughts about taking your own life, get help right away. You can go to your nearest emergency  department or call:  Your local emergency services (911 in the [place]  A suicide crisis helpline, such as the National Suicide Prevention Lifeline at 606-468-23941-(601)455-1880. This is open 24-hours a day.  Summary  If you are living with depression, there are ways to help you recover from it and also ways to prevent it from coming back.  Work with your health care team to create a management plan that includes counseling, stress management techniques, and healthy lifestyle habits. This information is not intended to replace advice given to you by your health care provider. Make sure you discuss any questions you have with your health care provider. Document Released: 05/12/2016 Document Revised: 05/12/2016 Document Reviewed: 05/12/2016 Elsevier Interactive Patient Education  [employer]2018 [employer]

## 2016-12-10 ENCOUNTER — Encounter: Payer: Self-pay | Admitting: Physician Assistant

## 2016-12-10 DIAGNOSIS — R635 Abnormal weight gain: Secondary | ICD-10-CM | POA: Insufficient documentation

## 2016-12-11 ENCOUNTER — Ambulatory Visit: Payer: Self-pay | Admitting: Osteopathic Medicine

## 2016-12-23 ENCOUNTER — Ambulatory Visit (INDEPENDENT_AMBULATORY_CARE_PROVIDER_SITE_OTHER): Payer: Medicaid Other | Admitting: Licensed Clinical Social Worker

## 2016-12-23 DIAGNOSIS — F418 Other specified anxiety disorders: Secondary | ICD-10-CM | POA: Diagnosis not present

## 2016-12-30 ENCOUNTER — Ambulatory Visit (HOSPITAL_COMMUNITY): Payer: Self-pay | Admitting: Licensed Clinical Social Worker

## 2016-12-31 ENCOUNTER — Encounter (HOSPITAL_COMMUNITY): Payer: Self-pay | Admitting: Licensed Clinical Social Worker

## 2016-12-31 NOTE — Progress Notes (Signed)
   THERAPIST PROGRESS NOTE  Session Time: 4:10-5pm  Participation Level: Active  Behavioral Response: NeatAlertEuthymic  Type of Therapy: Individual Therapy  Treatment Goals addressed: Coping  Interventions: CBT  Summary: Gwendolyn Bowen is a 36 y.o. female.Pt discussed her psychiatric symptoms and current life events. Pt reports she is moving forward in her life.  SHe has a new boyfriend which is causing her strife within her biological family and x husband. Processed choices/consquences with pt. Pt is liking the benefits of working full time and also the ability to pay her bills and provide for her children. Pt is still working on her own self care and trying to put herself first. Pt is using the mindfulness skills to help with her stressors. Taught pt meditation body scan and pt practiced in session. Encouraged pt to join the OP group 1x weekly for more support and skill building. Pt was in agreement to the suggestion.      Suicidal/Homicidal: Nowithout intent/plan  Therapist Response: Assessed pt's current functioning and reviewed progress.  Assisted pt with her self care, consequences/choices, job,  and transition and processing for the management of her stressors.  Plan: Return again in 3 weeks. Discuss basic CBT skills.  Diagnosis: Axis I: Depression with anxiety      MACKENZIE,LISBETH S, LCAS 12/23/16

## 2017-01-01 ENCOUNTER — Ambulatory Visit (INDEPENDENT_AMBULATORY_CARE_PROVIDER_SITE_OTHER): Payer: PRIVATE HEALTH INSURANCE | Admitting: Physician Assistant

## 2017-01-01 ENCOUNTER — Encounter: Payer: Self-pay | Admitting: Physician Assistant

## 2017-01-01 VITALS — BP 108/66 | HR 69 | Ht 63.0 in | Wt 153.0 lb

## 2017-01-01 DIAGNOSIS — F419 Anxiety disorder, unspecified: Secondary | ICD-10-CM | POA: Diagnosis not present

## 2017-01-01 DIAGNOSIS — F99 Mental disorder, not otherwise specified: Secondary | ICD-10-CM | POA: Diagnosis not present

## 2017-01-01 DIAGNOSIS — F329 Major depressive disorder, single episode, unspecified: Secondary | ICD-10-CM

## 2017-01-01 DIAGNOSIS — R635 Abnormal weight gain: Secondary | ICD-10-CM | POA: Diagnosis not present

## 2017-01-01 DIAGNOSIS — F5105 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 disorder: Secondary | ICD-10-CM | POA: Diagnosis not present

## 2017-01-01 DIAGNOSIS — E663 Overweight: Secondary | ICD-10-CM

## 2017-01-01 DIAGNOSIS — F3341 Major depressive disorder, recurrent, in partial remission: Secondary | ICD-10-CM

## 2017-01-01 MED ORDER — ESCITALOPRAM OXALATE 20 MG PO TABS: 20.0000 mg | ORAL_TABLET | Freq: Every day | ORAL | 3 refills | Status: DC

## 2017-01-01 MED ORDER — ZOLPIDEM TARTRATE 5 MG PO TABS: 5.0000 mg | ORAL_TABLET | Freq: Every evening | ORAL | 0 refills | Status: DC | PRN

## 2017-01-01 MED ORDER — PHENTERMINE HCL 37.5 MG PO TABS: ORAL_TABLET | ORAL | 0 refills | Status: DC

## 2017-01-01 NOTE — Patient Instructions (Signed)
Phentermine can worsen your insomnia. Take first thing in the morning before breakfast with plenty of water If you experience mood change or sleep disturbance, stop the Phentermine If you experience headache, sweating, palpitations, muscle twitching, agitation, confusion, stop the Phentermine and go to the nearest ER Follow-up in 4 weeks for weight check  Serotonin Syndrome Serotonin is a brain chemical that regulates the nervous system, which includes the brain, spinal cord, and nerves. Serotonin appears to play a role in all types of behavior, including appetite, emotions, movement, thinking, and response to stress. Excessively high levels of serotonin in the body can cause serotonin syndrome, which is a very dangerous condition. What are the causes? This condition can be caused by taking medicines or drugs that increase the level of serotonin in your body. These include:  Antidepressant medicines.  Migraine medicines.  Certain pain medicines.  Certain recreational drugs, including ecstasy, LSD, cocaine, and amphetamines.  Over-the-counter cough or cold medicines that contain dextromethorphan.  Certain herbal supplements, including St. John's wort, ginseng, and nutmeg.  This condition usually occurs when you take these medicines or drugs in combination, but it can also happen with a high dose of a single medicine or drug. What increases the risk? This condition is more likely to develop in:  People who have recently increased the dosage of medicine that increases the serotonin level.  People who just started taking medicine that increases the serotonin level.  What are the signs or symptoms? Symptoms of this condition usually happens within several hours of a medicine change. Symptoms include:  Headache.  Muscle twitching or stiffness.  Diarrhea.  Confusion.  Restlessness or agitation.  Shivering or goose bumps.  Loss of muscle coordination.  Rapid heart  rate.  Sweating.  Severe cases of serotonin syndromecan cause:  Irregular heartbeat.  Seizures.  Loss of consciousness.  High fever.  How is this diagnosed? This condition is diagnosed with a medical history and physical exam. You will be asked aboutyour symptoms and your use of medicines and recreational drugs. Your health care provider may also order lab work or additional tests to rule out other causes of your symptoms. How is this treated? The treatment for this condition depends on the severity of your symptoms. For mild cases, stopping the medicine that caused your condition is usually all that is needed. For moderate to severe cases, hospitalization is required to monitor you and to prevent further muscle damage. Follow these instructions at home:  Take over-the-counter and prescription medicines only as told by your health care provider. This is important.  Check with your health care provider before you start taking any new prescriptions, over-the-counter medicines, herbs, or supplements.  Avoid combining any medicines that can cause this condition to occur.  Keep all follow-up visits as told by your health care provider.This is important.  Maintain a healthy lifestyle. ? Eat healthy foods. ? Get plenty of sleep. ? Exercise regularly. ? Do not drink alcohol. ? Do not use recreational drugs. Contact a health care provider if:  Medicines do not seem to be helping.  Your symptoms do not improve or they get worse.  You have trouble taking care of yourself. Get help right away if:  You have worsening confusion, severe headache, chest pain, high fever, seizures, or loss of consciousness.  You have serious thoughts about hurting yourself or others.  You experience serious side effects of medicine, such as swelling of your face, lips, tongue, or throat. This information is not intended to  replace advice given to you by your health care provider. Make sure you  discuss any questions you have with your health care provider. Document Released: 07/17/2004 Document Revised: 02/02/2016 Document Reviewed: 06/22/2014 Elsevier Interactive Patient Education  [employer]2018 [employer]

## 2017-01-01 NOTE — Progress Notes (Signed)
HPI:                                                                Gwendolyn Bowen is a 36 y.o. female who presents to [facility] today for depression follow-up  Depression/Anxiety: transitioned from Effexor to Lexapro without difficulty. She is followed by a counselor, twice per month. She is also starting group therapy weekly on Tuesdays. Denies symptoms of mania/hypomania. Denies suicidal thinking. Denies auditory/visual hallucinations.  Weight: patient reports abnormal weight gain over the last year. She has been going to a weight loss clinic without success. She reports that she tracks her calories and food with [employer], but still is unable to lose weight. She walks regularly for exercise. Her goal weight is 135 pounds.    Past Medical History:  Diagnosis Date  . Arthritis   . Depression   . Heart murmur   . Migraines    Past Surgical History:  Procedure Laterality Date  . DILATATION & CURETTAGE/HYSTEROSCOPY WITH TRUECLEAR     Social History  Substance Use Topics  . Smoking status: Never Smoker  . Smokeless tobacco: Never Used  . Alcohol use No   family history includes Bell's palsy in her mother; Cancer in her mother; Clotting disorder in her mother; Hypertension in her mother; Rheum arthritis in her mother.  ROS: negative except as noted in the HPI  Medications: Current Outpatient Prescriptions  Medication Sig Dispense Refill  . albuterol (PROVENTIL HFA;VENTOLIN HFA) 108 (90 Base) MCG/ACT inhaler Inhale 1 puff into the lungs every 4 (four) hours as needed for wheezing. 1 Inhaler 0  . escitalopram (LEXAPRO) 20 MG tablet One half tab daily for a week then one tab by mouth daily 30 tablet 1  . FLONASE 50 MCG/ACT nasal spray Place 1 spray into both nostrils daily. 0.5 g 2  . venlafaxine XR (EFFEXOR XR) 37.5 MG 24 hr capsule Two tabs daily for 1 week, then 1 tab daily for 1 week 21 capsule 0  . zolpidem (AMBIEN) 5 MG  tablet Take 1 tablet (5 mg total) by mouth at bedtime as needed for sleep. 30 tablet 0   No current facility-administered medications for this visit.    Allergies  Allergen Reactions  . Codeine Anaphylaxis  . Nsaids Anaphylaxis  . Aleve [Naproxen Sodium]   . Amoxicillin     Tolerates cephalosporins  . Asa [Aspirin]   . Ibuprofen   . Penicillins   . Sulfa Antibiotics        Objective:  BP 108/66   Pulse 69   Ht 5\' 3"  (1.6 m)   Wt 153 lb (69.4 kg)   LMP 04/27/2014   BMI 27.10 kg/m  Gen: well-groomed, cooperative, not ill-appearing, no distress HEENT: normal conjunctiva, trachea midline Pulm: Normal work of breathing, normal phonation Neuro: alert and oriented x 3, EOM's intact, no tremor MSK: moving all extremities, normal gait and station Skin: warm, dry, intact; no rashes or lesions on exposed skin Psych: good eye contact, euthymic mood, affect mood-congruent, normal speech, normal thought content  Depression screen [facility] 2/9 01/01/2017 12/09/2016 09/15/2016 04/07/2016  Decreased Interest 0 0 1 2  Down, Depressed, Hopeless 1 1 3 2   PHQ - 2 Score 1 1 4  4  Altered sleeping 3 3 3 3   Tired, decreased energy 3 3 3 3   Change in appetite 1 3 2 3   Feeling bad or failure about yourself  0 0 2 3  Trouble concentrating 0 2 0 2  Moving slowly or fidgety/restless 1 2 0 2  Suicidal thoughts 0 0 0 0  PHQ-9 Score 9 14 14 20    GAD 7 : Generalized Anxiety Score 01/01/2017 12/09/2016 09/15/2016 04/07/2016  Nervous, Anxious, on Edge 2 1 3 3   Control/stop worrying 1 0 3 3  Worry too much - different things 1 1 3 2   Trouble relaxing 1 1 1 3   Restless 0 2 1 2   Easily annoyed or irritable 1 1 2 2   Afraid - awful might happen 0 0 0 0  Total GAD 7 Score 6 6 13 15   Anxiety Difficulty - - - Very difficult     No results found for this or any previous visit (from the past 72 hour(s)). No results found.    Assessment and Plan: 36 y.o. female with   1. Recurrent major depressive  disorder, in partial remission (HCC) - PHQ9 score 9, improved from 14. Continue Lexapro - escitalopram (LEXAPRO) 20 MG tablet; Take 1 tablet (20 mg total) by mouth daily.  Dispense: 30 tablet; Refill: 3  2. Anxiety and depression - GAD7 score 6, stable - escitalopram (LEXAPRO) 20 MG tablet; Take 1 tablet (20 mg total) by mouth daily.  Dispense: 30 tablet; Refill: 3  3. 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 disorder - zolpidem (AMBIEN) 5 MG tablet; Take 1 tablet (5 mg total) by mouth at bedtime as needed for sleep.  Dispense: 30 tablet; Refill: 0  4. Abnormal weight gain Wt Readings from Last 3 Encounters:  01/01/17 153 lb (69.4 kg)  12/09/16 154 lb (69.9 kg)  09/15/16 144 lb (65.3 kg)  - discussed that stimulant medication can worsen insomnia. Discussed rare, but possible risk of serotonin syndrome. Patient educated on signs/symptoms. Instructed to discontinue Phentermine if sleep disturbance occurs or signs of serotonin syndrome. - phentermine (ADIPEX-P) 37.5 MG tablet; One tab by mouth qAM  Dispense: 30 tablet; Refill: 0  5. Overweight (BMI 25.0-29.9) - phentermine (ADIPEX-P) 37.5 MG tablet; One tab by mouth qAM  Dispense: 30 tablet; Refill: 0  Patient education and anticipatory guidance given Patient agrees with treatment plan Follow-up in 4 weeks for weight check or sooner as needed   Levonne Hubertharley E. Cummings PA-C

## 2017-01-05 DIAGNOSIS — E663 Overweight: Secondary | ICD-10-CM | POA: Insufficient documentation

## 2017-01-06 ENCOUNTER — Ambulatory Visit (INDEPENDENT_AMBULATORY_CARE_PROVIDER_SITE_OTHER): Payer: Medicaid Other | Admitting: Licensed Clinical Social Worker

## 2017-01-06 ENCOUNTER — Ambulatory Visit: Payer: Self-pay | Admitting: Physician Assistant

## 2017-01-06 DIAGNOSIS — F418 Other specified anxiety disorders: Secondary | ICD-10-CM | POA: Diagnosis not present

## 2017-01-07 ENCOUNTER — Encounter (HOSPITAL_COMMUNITY): Payer: Self-pay | Admitting: Licensed Clinical Social Worker

## 2017-01-07 NOTE — Progress Notes (Signed)
Daily Group Progress Note     Program: OP   Group Time: 5:30-6:30  Participation Level: Active  Behavioral Response: Appropriate  Type of Therapy:  Psychoeducation/Therapy  Summary of Progress: Pt participated in a continuation of a discussion on "The Happiness Advantage." Shawn Achor developed an Guide to Happiness."  Pt shared her definition of happiness. Pt was encouraged to continue working in the workbook on finding happiness.   Vernona RiegerLisbeth S. Mackenzie, LCAS

## 2017-01-13 ENCOUNTER — Ambulatory Visit (HOSPITAL_COMMUNITY): Payer: Self-pay | Admitting: Licensed Clinical Social Worker

## 2017-01-15 ENCOUNTER — Ambulatory Visit (HOSPITAL_COMMUNITY): Payer: Self-pay | Admitting: Licensed Clinical Social Worker

## 2017-01-20 ENCOUNTER — Ambulatory Visit (HOSPITAL_COMMUNITY): Payer: Self-pay | Admitting: Licensed Clinical Social Worker

## 2017-01-22 ENCOUNTER — Ambulatory Visit (INDEPENDENT_AMBULATORY_CARE_PROVIDER_SITE_OTHER): Payer: Medicaid Other | Admitting: Licensed Clinical Social Worker

## 2017-01-22 DIAGNOSIS — F418 Other specified anxiety disorders: Secondary | ICD-10-CM | POA: Diagnosis not present

## 2017-01-23 ENCOUNTER — Encounter (HOSPITAL_COMMUNITY): Payer: Self-pay | Admitting: Licensed Clinical Social Worker

## 2017-01-23 NOTE — Progress Notes (Signed)
   THERAPIST PROGRESS NOTE  Session Time: 4:10-5pm  Participation Level: Active  Behavioral Response: NeatAlertEuthymic  Type of Therapy: Individual Therapy  Treatment Goals addressed: Coping  Interventions: CBT  Summary: Gwendolyn Bowen is a 36 y.o. female.Pt discussed her psychiatric symptoms and current life events. Pt reports her migraines have increased within the last 2 weeks. She has missed the last 2 weeks of OP group. Pt is continuing to work on herself and her relationships. Clinician challenged pt's beliefs, where pt was able to formulate healthy thoughts: relationships at work, family and other relationships. Pt shared she is learning to be "the bigger person" in situations at work and just life. She is still using her mindfulness skills for her stressors. Still stress with pt to "put herself first."       Suicidal/Homicidal: Nowithout intent/plan  Therapist Response: Assessed pt's current functioning and reviewed progress.  Assisted pt with her self care, emotional regulation, job,  and processing for the management of her stressors.  Plan: Return again in 3 weeks. Discuss basic CBT skills. Continue with OP group.  Diagnosis: Axis I: Depression with anxiety      Jurell Basista S, LCAS 01/22/17

## 2017-01-27 ENCOUNTER — Ambulatory Visit (INDEPENDENT_AMBULATORY_CARE_PROVIDER_SITE_OTHER): Payer: Medicaid Other | Admitting: Licensed Clinical Social Worker

## 2017-01-27 DIAGNOSIS — F418 Other specified anxiety disorders: Secondary | ICD-10-CM | POA: Diagnosis not present

## 2017-01-29 ENCOUNTER — Encounter: Payer: Self-pay | Admitting: Physician Assistant

## 2017-01-29 ENCOUNTER — Encounter (HOSPITAL_COMMUNITY): Payer: Self-pay | Admitting: Licensed Clinical Social Worker

## 2017-01-29 ENCOUNTER — Ambulatory Visit (INDEPENDENT_AMBULATORY_CARE_PROVIDER_SITE_OTHER): Payer: PRIVATE HEALTH INSURANCE | Admitting: Physician Assistant

## 2017-01-29 DIAGNOSIS — R635 Abnormal weight gain: Secondary | ICD-10-CM | POA: Diagnosis not present

## 2017-01-29 DIAGNOSIS — E663 Overweight: Secondary | ICD-10-CM

## 2017-01-29 MED ORDER — PHENTERMINE HCL 37.5 MG PO TABS: ORAL_TABLET | ORAL | 0 refills | Status: DC

## 2017-01-29 NOTE — Progress Notes (Signed)
Pt is here for a weight check and BP check.  Denies trouble sleeping, palpitations, and medication problems.   Pt has lost weight, a refill for phentermine was given to the patient per C. Eddie Candleummings, PA-C.  Pt advised to schedule a nurse visit in 1 month.

## 2017-01-29 NOTE — Progress Notes (Signed)
Daily Group Progress Note     Program: OP   Group Time: 5:30-6:30  Participation Level: Active  Behavioral Response: Appropriate  Type of Therapy:  Psychoeducation/Therapy  Summary of Progress: Pt participated in a discussion on adjusting to new life experiences. Pt shared she is having difficulty being a new step mom to her boyfriend's daughter who lives with them. Pt expressed she is having almost daily migraines due to this stress.  Pt asked group for feedback and was given appropriate feedback. Pt was encouraged to continue using her knowledge about her migraines and her coping skills for her anxiety.   Vernona RiegerLisbeth S. Bradey Luzier, LCAS

## 2017-02-03 ENCOUNTER — Ambulatory Visit (HOSPITAL_COMMUNITY): Payer: Self-pay | Admitting: Licensed Clinical Social Worker

## 2017-02-05 ENCOUNTER — Ambulatory Visit (HOSPITAL_COMMUNITY): Payer: Self-pay | Admitting: Licensed Clinical Social Worker

## 2017-02-10 ENCOUNTER — Ambulatory Visit (HOSPITAL_COMMUNITY): Payer: Self-pay | Admitting: Licensed Clinical Social Worker

## 2017-02-12 ENCOUNTER — Other Ambulatory Visit: Payer: Self-pay | Admitting: Physician Assistant

## 2017-02-12 DIAGNOSIS — F3341 Major depressive disorder, recurrent, in partial remission: Secondary | ICD-10-CM

## 2017-02-19 ENCOUNTER — Ambulatory Visit (INDEPENDENT_AMBULATORY_CARE_PROVIDER_SITE_OTHER): Payer: Self-pay | Admitting: Licensed Clinical Social Worker

## 2017-02-19 ENCOUNTER — Encounter (HOSPITAL_COMMUNITY): Payer: Self-pay | Admitting: Licensed Clinical Social Worker

## 2017-02-19 DIAGNOSIS — F418 Other specified anxiety disorders: Secondary | ICD-10-CM

## 2017-02-19 NOTE — Progress Notes (Signed)
   THERAPIST PROGRESS NOTE  Session Time: 3:10-4pm  Participation Level: Active  Behavioral Response: NeatAlertEuthymic  Type of Therapy: Individual Therapy  Treatment Goals addressed: Coping  Interventions: CBT  Summary: Gwendolyn Bowen is a 36 y.o. female.Pt discussed her psychiatric symptoms and current life events. Pt is stressed because her 36yo is not getting along with her siblings. She feels her medication needs to be changed. She will check on the medication [facility] She has missed the last 2 weeks of OP group due to the children starting school. She can only come to group every other week due to parenting obligations. Pt is continuing to work on herself and her relationships. Clinician challenged pt's beliefs, where pt was able to formulate healthy thoughts: relationship, family and x husband. Pt is second guessing herself in her choices. Taught pt basic cbt concepts and the thought emotion connection and alternative perspectives. Pt was receptive to intervention.       Suicidal/Homicidal: Nowithout intent/plan  Therapist Response: Assessed pt's current functioning and reviewed progress.  Assisted pt with her , emotional regulation skills, cbt concepts ,  and processing for the management of her stressors.  Plan: Return again in 3 weeks. Discuss basic CBT skills. Continue with OP group eery other week.  Diagnosis: Axis I: Depression with anxiety      Samel Bruna S, LCAS 02/19/17

## 2017-02-26 ENCOUNTER — Ambulatory Visit (INDEPENDENT_AMBULATORY_CARE_PROVIDER_SITE_OTHER): Payer: Medicaid Other | Admitting: Physician Assistant

## 2017-02-26 VITALS — BP 108/59 | HR 71 | Ht 63.0 in | Wt 147.1 lb

## 2017-02-26 DIAGNOSIS — R635 Abnormal weight gain: Secondary | ICD-10-CM

## 2017-02-26 DIAGNOSIS — E663 Overweight: Secondary | ICD-10-CM | POA: Diagnosis not present

## 2017-02-26 MED ORDER — TOPIRAMATE 50 MG PO TABS: ORAL_TABLET | ORAL | 0 refills | Status: DC

## 2017-02-26 MED ORDER — PHENTERMINE HCL 37.5 MG PO TABS: ORAL_TABLET | ORAL | 0 refills | Status: DC

## 2017-02-26 NOTE — Progress Notes (Signed)
   Subjective:    Patient ID: Gwendolyn Bowen, female    DOB: 06-Jul-1980, 36 y.o.   MRN: [id number]  HPI Pt is here for a BP and weight check. Pt denies chest pain, shortness of breath, palpitations, headaches, or any problems with medication.    Review of Systems     Objective:   Physical Exam        Assessment & Plan:  Pt has lost weight.  Pt advised to schedule a follow-up with nurse/provider in 30 days.

## 2017-02-26 NOTE — Progress Notes (Signed)
Break phentermine in half, talk half tab in the morning and half tab in the afternoon Adding Topamax at bedtime. Take 1/2 tab for the first week, then the whole tab Follow-up with PCP in 4 weeks

## 2017-03-05 ENCOUNTER — Ambulatory Visit (INDEPENDENT_AMBULATORY_CARE_PROVIDER_SITE_OTHER): Payer: Medicaid Other | Admitting: Licensed Clinical Social Worker

## 2017-03-05 DIAGNOSIS — F418 Other specified anxiety disorders: Secondary | ICD-10-CM | POA: Diagnosis not present

## 2017-03-11 ENCOUNTER — Encounter (HOSPITAL_COMMUNITY): Payer: Self-pay | Admitting: Licensed Clinical Social Worker

## 2017-03-11 NOTE — Progress Notes (Signed)
   THERAPIST PROGRESS NOTE  Session Time: 3:10-4pm  Participation Level: Active  Behavioral Response: NeatAlertEuthymic  Type of Therapy: Individual Therapy  Treatment Goals addressed: Coping  Interventions: CBT  Summary: Gwendolyn TOMASETTI is a 36 y.o. female.Pt discussed her psychiatric symptoms and current life events. Pt reports relationships are better between the children. Discussed with pt her continuing OP group. At this point she is going to drop out of the group due to parenting issues and the necessity to be at home int he evenings.  The company where pt works has been bought out so she may lose her job. Processed with pt and taught pt problem solving skills. 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 Taught pt communication skills to use when communicating with  her xhusband.Their discussions are fraught with tension and anger which is displayed in front of the children. Role played positive communication with pt. She was receptive to suggestions.         Suicidal/Homicidal: Nowithout intent/plan  Therapist Response: Assessed pt's current functioning and reviewed progress.  Assisted pt with problem solving skills, job search, potential loss of job, communi cation skills. ,  and processing for the management of her stressors.  Plan: Return again in 3 weeks. Discuss basic CBT skills. Continue with OP group eery other week.  Diagnosis: Axis I: Depression with anxiety      MACKENZIE,LISBETH S, LCAS 03/05/17

## 2017-03-19 ENCOUNTER — Encounter (HOSPITAL_COMMUNITY): Payer: Self-pay | Admitting: Licensed Clinical Social Worker

## 2017-03-19 ENCOUNTER — Ambulatory Visit (INDEPENDENT_AMBULATORY_CARE_PROVIDER_SITE_OTHER): Payer: Medicaid Other | Admitting: Licensed Clinical Social Worker

## 2017-03-19 DIAGNOSIS — F418 Other specified anxiety disorders: Secondary | ICD-10-CM

## 2017-03-19 NOTE — Progress Notes (Signed)
   THERAPIST PROGRESS NOTE  Session Time: 3:10-4pm  Participation Level: Active  Behavioral Response: NeatAlertEuthymic  Type of Therapy: Individual Therapy  Treatment Goals addressed: Coping  Interventions: CBT  Summary: Gwendolyn Bowen is a 36 y.o. female.Pt discussed her psychiatric symptoms and current life events. Pt reports her anxiety symptoms have increased. Her current stressors are hier job, her x husband and the resposibilities at home. Pt does not feel that she is reaching her full potential at work. Pt is looking for a new job. Role played effective communication styles to use with her x husband. Again, taught pt  effective communication styles. Pt becomes frustrated in practicing communciation styles and goes back to her ineffective ways  Of communicating. Discussed with pt the reasons for positive communication with x husband, in front of the children. Pt reports her stress has increased. Taught pt grounding sequence for mindfulness activity. Encourage pt to continue using mindfulness activities for stress and anxiety.          Suicidal/Homicidal: Nowithout intent/plan  Therapist Response: Assessed pt's current functioning and reviewed progress.  Assisted pt with employment, communi cation skills. ,  and processing for the management of her stressors.  Plan: Return again in 3 weeks. Discuss basic CBT skills.   Diagnosis: Axis I: Depression with anxiety      Markiyah Gahm S, LCAS 03/19/17

## 2017-03-26 ENCOUNTER — Ambulatory Visit (INDEPENDENT_AMBULATORY_CARE_PROVIDER_SITE_OTHER): Payer: Medicaid Other | Admitting: Physician Assistant

## 2017-03-26 ENCOUNTER — Encounter: Payer: Self-pay | Admitting: Physician Assistant

## 2017-03-26 ENCOUNTER — Other Ambulatory Visit: Payer: Self-pay

## 2017-03-26 VITALS — BP 105/68 | HR 81 | Wt 149.0 lb

## 2017-03-26 DIAGNOSIS — R635 Abnormal weight gain: Secondary | ICD-10-CM

## 2017-03-26 DIAGNOSIS — E663 Overweight: Secondary | ICD-10-CM | POA: Diagnosis not present

## 2017-03-26 MED ORDER — TOPIRAMATE 50 MG PO TABS: 75.0000 mg | ORAL_TABLET | Freq: Every day | ORAL | 1 refills | Status: DC

## 2017-03-26 MED ORDER — PHENTERMINE HCL 15 MG PO CAPS: 15.0000 mg | ORAL_CAPSULE | ORAL | 1 refills | Status: DC

## 2017-03-26 NOTE — Progress Notes (Signed)
Rx directed updated per PCP.

## 2017-03-26 NOTE — Progress Notes (Signed)
HPI:                                                                Gwendolyn Bowen is a 36 y.o. female who presents to [facility] today for weight check  Patient has been taking Phentermine 37.5mg  in a divided dose and Topamax  in the afternoon. She has gained 2 pounds over the last 4 weeks. She reports she only eats 1-2 meals per day. She is not currently counting calories. She is lightly active, on her feet at work.  Past Medical History:  Diagnosis Date  . Arthritis   . Depression   . Heart murmur   . Migraines    Past Surgical History:  Procedure Laterality Date  . ABDOMINAL HYSTERECTOMY    . DILATATION & CURETTAGE/HYSTEROSCOPY WITH TRUECLEAR     Social History  Substance Use Topics  . Smoking status: Never Smoker  . Smokeless tobacco: Never Used  . Alcohol use No   family history includes Bell's palsy in her mother; Cancer in her mother; Clotting disorder in her mother; Hypertension in her mother; Rheum arthritis in her mother.  ROS: negative except as noted in the HPI  Medications: Current Outpatient Prescriptions  Medication Sig Dispense Refill  . albuterol (PROVENTIL HFA;VENTOLIN HFA) 108 (90 Base) MCG/ACT inhaler Inhale 1 puff into the lungs every 4 (four) hours as needed for wheezing. 1 Inhaler 0  . escitalopram (LEXAPRO) 20 MG tablet Take 1 tablet (20 mg total) by mouth daily. 30 tablet 3  . topiramate (TOPAMAX) 50 MG tablet Take 1.5 tablets (75 mg total) by mouth at bedtime. One half tab by mouth QHS for a week, then one tab by mouth QHS. 45 tablet 1  . phentermine 15 MG capsule Take 1 capsule (15 mg total) by mouth every morning. 30 capsule 1   No current facility-administered medications for this visit.    Allergies  Allergen Reactions  . Codeine Anaphylaxis  . Nsaids Anaphylaxis  . Aleve [Naproxen Sodium]   . Amoxicillin     Tolerates cephalosporins  . Asa [Aspirin]   . Ibuprofen   . Penicillins    . Sulfa Antibiotics        Objective:  BP 105/68   Pulse 81   Wt 149 lb (67.6 kg)   LMP 04/27/2014   BMI 26.39 kg/m  Gen:  alert, not ill-appearing, no distress, appropriate for age HEENT: head normocephalic without obvious abnormality, conjunctiva and cornea clear, trachea midline Pulm: Normal work of breathing, normal phonation Neuro: alert and oriented x 3, no tremor MSK: extremities atraumatic, normal gait and station Skin: intact, no rashes on exposed skin, no jaundice, no cyanosis Psych: well-groomed, cooperative, good eye contact, euthymic mood, affect mood-congruent, speech is articulate, and thought processes clear and goal-directed  No results found for this or any previous visit (from the past 72 hour(s)). No results found.    Assessment and Plan: 36 y.o. female with   1. Abnormal weight gain Wt Readings from Last 3 Encounters:  03/26/17 149 lb (67.6 kg)  02/26/17 147 lb 1.6 oz (66.7 kg)  01/29/17 148 lb (67.1 kg)  - patient has plateaued on phentermine - reducing Phentermine and increasing Topamax to Qsymia dose - discussed  importance of measuring food and not meal skipping - 1200 calories per day. DASH or Mediterranean diet - phentermine 15 MG capsule; Take 1 capsule (15 mg total) by mouth every morning.  Dispense: 30 capsule; Refill: 1 - topiramate (TOPAMAX) 50 MG tablet; Take 1.5 tablets (75 mg total) by mouth at bedtime. One half tab by mouth QHS for a week, then one tab by mouth QHS.  Dispense: 45 tablet; Refill: 1  2. Overweight (BMI 25.0-29.9) - phentermine 15 MG capsule; Take 1 capsule (15 mg total) by mouth every morning.  Dispense: 30 capsule; Refill: 1 - topiramate (TOPAMAX) 50 MG tablet; Take 1.5 tablets (75 mg total) by mouth at bedtime. One half tab by mouth QHS for a week, then one tab by mouth QHS.  Dispense: 45 tablet; Refill: 1  Patient education and anticipatory guidance given Patient agrees with treatment plan Follow-up in 8 weeks for  weight check or sooner as needed  Levonne Hubert PA-C

## 2017-03-26 NOTE — Patient Instructions (Addendum)
- Topamax 1.5 tabs in the evening  - Phentermine 1 tab in the morning - Restrict calories to 1200 per day - Measure and track calories with an app such as MyFitnessPal - Do not skip meals. Eat 3, high protein, low-carb meals per day. Largest meal of the day should be at breakfast and smallest meal of the day should be at dinner - DASH or Mediterranean diet are most sustainable   DASH Eating Plan DASH stands for "Dietary Approaches to Stop Hypertension." The DASH eating plan is a healthy eating plan that has been shown to reduce high blood pressure (hypertension). It may also reduce your risk for type 2 diabetes, heart disease, and stroke. The DASH eating plan may also help with weight loss. What are tips for following this plan? General guidelines  Avoid eating more than 2,300 mg (milligrams) of salt (sodium) a day. If you have hypertension, you may need to reduce your sodium intake to 1,500 mg a day.  Limit alcohol intake to no more than 1 drink a day for nonpregnant women and 2 drinks a day for men. One drink equals 12 oz of beer, 5 oz of wine, or 1 oz of hard liquor.  Work with your health care provider to maintain a healthy body weight or to lose weight. Ask what an ideal weight is for you.  Get at least 30 minutes of exercise that causes your heart to beat faster (aerobic exercise) most days of the week. Activities may include walking, swimming, or biking.  Work with your health care provider or diet and nutrition specialist (dietitian) to adjust your eating plan to your individual calorie needs. Reading food labels  Check food labels for the amount of sodium per serving. Choose foods with less than 5 percent of the Daily Value of sodium. Generally, foods with less than 300 mg of sodium per serving fit into this eating plan.  To find whole grains, look for the word "whole" as the first word in the ingredient list. Shopping  Buy products labeled as "low-sodium" or "no salt  added."  Buy fresh foods. Avoid canned foods and premade or frozen meals. Cooking  Avoid adding salt when cooking. Use salt-free seasonings or herbs instead of table salt or sea salt. Check with your health care provider or pharmacist before using salt substitutes.  Do not fry foods. Cook foods using healthy methods such as baking, boiling, grilling, and broiling instead.  Cook with heart-healthy oils, such as olive, canola, soybean, or sunflower oil. Meal planning   Eat a balanced diet that includes: ? 5 or more servings of fruits and vegetables each day. At each meal, try to fill half of your plate with fruits and vegetables. ? Up to 6-8 servings of whole grains each day. ? Less than 6 oz of lean meat, poultry, or fish each day. A 3-oz serving of meat is about the same size as a deck of cards. One egg equals 1 oz. ? 2 servings of low-fat dairy each day. ? A serving of nuts, seeds, or beans 5 times each week. ? Heart-healthy fats. Healthy fats called Omega-3 fatty acids are found in foods such as flaxseeds and coldwater fish, like sardines, salmon, and mackerel.  Limit how much you eat of the following: ? Canned or prepackaged foods. ? Food that is high in trans fat, such as fried foods. ? Food that is high in saturated fat, such as fatty meat. ? Sweets, desserts, sugary drinks, and other foods with  added sugar. ? Full-fat dairy products.  Do not salt foods before eating.  Try to eat at least 2 vegetarian meals each week.  Eat more home-cooked food and less restaurant, buffet, and fast food.  When eating at a restaurant, ask that your food be prepared with less salt or no salt, if possible. What foods are recommended? The items listed may not be a complete list. Talk with your dietitian about what dietary choices are best for you. Grains Whole-grain or whole-wheat bread. Whole-grain or whole-wheat pasta. Brown rice. [place]. Bulgur. Whole-grain and low-sodium cereals.  Pita bread. Low-fat, low-sodium crackers. Whole-wheat flour tortillas. Vegetables Fresh or frozen vegetables (raw, steamed, roasted, or grilled). Low-sodium or reduced-sodium tomato and vegetable juice. Low-sodium or reduced-sodium tomato sauce and tomato paste. Low-sodium or reduced-sodium canned vegetables. Fruits All fresh, dried, or frozen fruit. Canned fruit in natural juice (without added sugar). Meat and other protein foods Skinless chicken or [place]. Ground chicken or [place]. Pork with fat trimmed off. Fish and seafood. Egg whites. Dried beans, peas, or lentils. Unsalted nuts, nut butters, and seeds. Unsalted canned beans. Lean cuts of beef with fat trimmed off. Low-sodium, lean deli meat. Dairy Low-fat (1%) or fat-free (skim) milk. Fat-free, low-fat, or reduced-fat cheeses. Nonfat, low-sodium ricotta or cottage cheese. Low-fat or nonfat yogurt. Low-fat, low-sodium cheese. Fats and oils Soft margarine without trans fats. Vegetable oil. Low-fat, reduced-fat, or light mayonnaise and salad dressings (reduced-sodium). Canola, safflower, olive, soybean, and sunflower oils. Avocado. Seasoning and other foods Herbs. Spices. Seasoning mixes without salt. Unsalted popcorn and pretzels. Fat-free sweets. What foods are not recommended? The items listed may not be a complete list. Talk with your dietitian about what dietary choices are best for you. Grains Baked goods made with fat, such as croissants, muffins, or some breads. Dry pasta or rice meal packs. Vegetables Creamed or fried vegetables. Vegetables in a cheese sauce. Regular canned vegetables (not low-sodium or reduced-sodium). Regular canned tomato sauce and paste (not low-sodium or reduced-sodium). Regular tomato and vegetable juice (not low-sodium or reduced-sodium). Angie Fava. Olives. Fruits Canned fruit in a light or heavy syrup. Fried fruit. Fruit in cream or butter sauce. Meat and other protein foods Fatty cuts of meat. Ribs. Fried  meat. Berniece Salines. Sausage. Bologna and other processed lunch meats. Salami. Fatback. Hotdogs. Bratwurst. Salted nuts and seeds. Canned beans with added salt. Canned or smoked fish. Whole eggs or egg yolks. Chicken or [place] with skin. Dairy Whole or 2% milk, cream, and half-and-half. Whole or full-fat cream cheese. Whole-fat or sweetened yogurt. Full-fat cheese. Nondairy creamers. Whipped toppings. Processed cheese and cheese spreads. Fats and oils Butter. Stick margarine. Lard. Shortening. Ghee. Bacon fat. Tropical oils, such as coconut, palm kernel, or palm oil. Seasoning and other foods Salted popcorn and pretzels. Onion salt, garlic salt, seasoned salt, table salt, and sea salt. Worcestershire sauce. Tartar sauce. Barbecue sauce. Teriyaki sauce. Soy sauce, including reduced-sodium. Steak sauce. Canned and packaged gravies. Fish sauce. Oyster sauce. Cocktail sauce. Horseradish that you find on the shelf. Ketchup. Mustard. Meat flavorings and tenderizers. Bouillon cubes. Hot sauce and Tabasco sauce. Premade or packaged marinades. Premade or packaged taco seasonings. Relishes. Regular salad dressings. Where to find more information:  National Heart, Lung, and [place]: https://wilson-eaton.com/  American Heart Association: www.heart.org Summary  The DASH eating plan is a healthy eating plan that has been shown to reduce high blood pressure (hypertension). It may also reduce your risk for type 2 diabetes, heart disease, and stroke.  With the DASH  eating plan, you should limit salt (sodium) intake to 2,300 mg a day. If you have hypertension, you may need to reduce your sodium intake to 1,500 mg a day.  When on the DASH eating plan, aim to eat more fresh fruits and vegetables, whole grains, lean proteins, low-fat dairy, and heart-healthy fats.  Work with your health care provider or diet and nutrition specialist (dietitian) to adjust your eating plan to your individual calorie needs. This information is  not intended to replace advice given to you by your health care provider. Make sure you discuss any questions you have with your health care provider. Document Released: 05/29/2011 Document Revised: 06/02/2016 Document Reviewed: 06/02/2016 Elsevier Interactive Patient Education  2017 [employer].

## 2017-03-27 ENCOUNTER — Ambulatory Visit: Payer: Self-pay

## 2017-04-02 ENCOUNTER — Encounter (HOSPITAL_COMMUNITY): Payer: Self-pay | Admitting: Licensed Clinical Social Worker

## 2017-04-02 ENCOUNTER — Ambulatory Visit (HOSPITAL_COMMUNITY): Payer: Self-pay | Admitting: Licensed Clinical Social Worker

## 2017-04-02 ENCOUNTER — Ambulatory Visit (INDEPENDENT_AMBULATORY_CARE_PROVIDER_SITE_OTHER): Payer: Medicaid Other | Admitting: Licensed Clinical Social Worker

## 2017-04-02 DIAGNOSIS — F418 Other specified anxiety disorders: Secondary | ICD-10-CM | POA: Diagnosis not present

## 2017-04-02 NOTE — Progress Notes (Signed)
   THERAPIST PROGRESS NOTE  Session Time: 3:10-4pm  Participation Level: Active  Behavioral Response: NeatAlertEuthymic  Type of Therapy: Individual Therapy  Treatment Goals addressed: Coping  Interventions: CBT  Summary: Gwendolyn Bowen is a 36 y.o. female.Pt discussed her psychiatric symptoms and current life events. Pt reports her anxiety symptoms have increased. Her current stressors are hier job and the blended family (new boyfriend and his daughter). The 4 daughters are struggling to get along. This add to pt's current anxiety level. Pt is still working at her same job but is looking to make a change. Pt's sister reached out to her. They have a contentious relationship but pt has hopes it will work out. Pt and her x husband are communicating better. Pt reports she is using her newly learned communication skills. Pt role played how she is now trying to communicate with him. Taught pt problem solving skills. As coparents they need positive ways to communicate and problem solve. Suggested to pt to restart her yoga. Suggested that it could be a group effort, teaching the girls how to do yoga so everyone can self soothe. Pt reported she also thinks she needs to go back to journaling, as suggested previously. She wants to journal her feelings.     Suicidal/Homicidal: Nowithout intent/plan  Therapist Response: Assessed pt's current functioning and reviewed progress.  Assisted pt with employment, communication skills,problem solving skills ,  and processing for the management of her stressors.  Plan: Return again in 3 weeks. Discuss basic CBT skills.   Diagnosis: Axis I: Depression with anxiety      MACKENZIE,LISBETH S, LCAS  04/02/17

## 2017-04-07 ENCOUNTER — Other Ambulatory Visit: Payer: Self-pay

## 2017-04-07 DIAGNOSIS — E663 Overweight: Secondary | ICD-10-CM

## 2017-04-07 DIAGNOSIS — R635 Abnormal weight gain: Secondary | ICD-10-CM

## 2017-04-07 MED ORDER — PHENTERMINE HCL 15 MG PO CAPS: 15.0000 mg | ORAL_CAPSULE | ORAL | 1 refills | Status: DC

## 2017-04-15 ENCOUNTER — Other Ambulatory Visit: Payer: Self-pay | Admitting: *Deleted

## 2017-04-15 ENCOUNTER — Telehealth: Payer: Self-pay | Admitting: *Deleted

## 2017-04-15 DIAGNOSIS — F329 Major depressive disorder, single episode, unspecified: Secondary | ICD-10-CM

## 2017-04-15 DIAGNOSIS — F419 Anxiety disorder, unspecified: Secondary | ICD-10-CM

## 2017-04-15 DIAGNOSIS — F3341 Major depressive disorder, recurrent, in partial remission: Secondary | ICD-10-CM

## 2017-04-15 MED ORDER — ESCITALOPRAM OXALATE 20 MG PO TABS: 20.0000 mg | ORAL_TABLET | Freq: Every day | ORAL | 0 refills | Status: DC

## 2017-04-15 NOTE — Telephone Encounter (Signed)
Patient called and states she did not go to work today because she had a migraine. I did advise patient that we do not write work notes excusing a day from work unless you were recently seen for an illness that warrants an excuse. The patient states "the guy that used to work there would always write me a note but he just left." I told her our office policy is that you can possibly get a work note if excusing you for a day or so at the provider's discretion if you were seen that day in an office visit. I also advised that if you have a chronic condition then you should get FMLA paper work on file for current job. An office visit is needed if she wants an excuse for work and even then it is at the providers discretion. The patient  Asked to be transferred to schedule an appointment.

## 2017-04-16 ENCOUNTER — Other Ambulatory Visit: Payer: Self-pay | Admitting: Physician Assistant

## 2017-04-16 ENCOUNTER — Encounter: Payer: Self-pay | Admitting: Physician Assistant

## 2017-04-16 ENCOUNTER — Encounter (HOSPITAL_COMMUNITY): Payer: Self-pay | Admitting: Licensed Clinical Social Worker

## 2017-04-16 ENCOUNTER — Ambulatory Visit (INDEPENDENT_AMBULATORY_CARE_PROVIDER_SITE_OTHER): Payer: Medicaid Other | Admitting: Physician Assistant

## 2017-04-16 ENCOUNTER — Ambulatory Visit (INDEPENDENT_AMBULATORY_CARE_PROVIDER_SITE_OTHER): Payer: Medicaid Other | Admitting: Licensed Clinical Social Worker

## 2017-04-16 VITALS — BP 103/56 | HR 79 | Wt 147.0 lb

## 2017-04-16 DIAGNOSIS — F3341 Major depressive disorder, recurrent, in partial remission: Secondary | ICD-10-CM | POA: Diagnosis not present

## 2017-04-16 DIAGNOSIS — G43009 Migraine without aura, not intractable, without status migrainosus: Secondary | ICD-10-CM

## 2017-04-16 DIAGNOSIS — F418 Other specified anxiety disorders: Secondary | ICD-10-CM | POA: Diagnosis not present

## 2017-04-16 MED ORDER — KETOROLAC TROMETHAMINE 30 MG/ML IJ SOLN
30.0000 mg | Freq: Once | INTRAMUSCULAR | Status: AC
  Administered 2017-04-16: 30 mg via INTRAMUSCULAR

## 2017-04-16 MED ORDER — ESCITALOPRAM OXALATE 20 MG PO TABS: 20.0000 mg | ORAL_TABLET | Freq: Every day | ORAL | 5 refills | Status: DC

## 2017-04-16 MED ORDER — TOPIRAMATE 50 MG PO TABS: 50.0000 mg | ORAL_TABLET | Freq: Two times a day (BID) | ORAL | 1 refills | Status: DC

## 2017-04-16 MED ORDER — SUMATRIPTAN SUCCINATE 6 MG/0.5ML ~~LOC~~ SOAJ: SUBCUTANEOUS | 1 refills | Status: DC

## 2017-04-16 MED ORDER — PROMETHAZINE HCL 25 MG PO TABS: 12.5000 mg | ORAL_TABLET | Freq: Four times a day (QID) | ORAL | 2 refills | Status: DC | PRN

## 2017-04-16 MED ORDER — DEXAMETHASONE SODIUM PHOSPHATE 4 MG/ML IJ SOLN
4.0000 mg | Freq: Once | INTRAMUSCULAR | Status: AC
  Administered 2017-04-16: 4 mg via INTRAMUSCULAR

## 2017-04-16 NOTE — Progress Notes (Signed)
   THERAPIST PROGRESS NOTE  Session Time: 3:10-4pm  Participation Level: Active  Behavioral Response: NeatAlertAnxious   Type of Therapy: Individual Therapy  Treatment Goals addressed: Coping  Interventions: CBT  Summary: Gwendolyn Bowen is a 36 y.o. female.Pt discussed her psychiatric symptoms and current life events. Pt reports her anxiety and depression symptoms have increased. She has an appt with Dr. Rene KocherEksir in about a month. Pt is still having problems at work. 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 She still continues to apply for other jobs. Validated pt on her choice to move forward in career enhancement. Her home life continues to be very stressful with blending families. Taught pt new problem solving skills that may assist with the new family dynamic in the home. Gave pt immediate feedback on how the chaos is affecting the children in the home and the family structure or structureless. Pt is resistant to try self soothing even after continued suggestions. Assisted pt in building hope and strengthening her resilience.      Suicidal/Homicidal: Nowithout intent/plan  Therapist Response: Assessed pt's current functioning and reviewed progress.  Assisted pt with problem solving skills, validation, immediate feedback, building hope and strengthening her resilence  and processing for the management of her stressors.  Plan: Return again in 3 weeks. Discuss basic CBT skills.   Diagnosis: Axis I: Depression with anxiety      Jaspal Pultz S, LCAS  04/16/17

## 2017-04-16 NOTE — Patient Instructions (Addendum)
- Do not use Sumatriptan for current migraine - Okay to start at the first sign of next headache. May repeat once 1 hour later if needed, but do not use more than twice in 24 hour period - Phenergan as needed for nausea - Increase Topamax to 50mg  twice a day for migraine prophylaxis   Migraine Headache A migraine headache is an intense, throbbing pain on one side or both sides of the head. Migraines may also cause other symptoms, such as nausea, vomiting, and sensitivity to light and noise. What are the causes? Doing or taking certain things may also trigger migraines, such as:  Alcohol.  Smoking.  Medicines, such as: ? Medicine used to treat chest pain (nitroglycerine). ? Birth control pills. ? Estrogen pills. ? Certain blood pressure medicines.  Aged cheeses, chocolate, or caffeine.  Foods or drinks that contain nitrates, glutamate, aspartame, or tyramine.  Physical activity.  Other things that may trigger a migraine include:  Menstruation.  Pregnancy.  Hunger.  Stress, lack of sleep, too much sleep, or fatigue.  Weather changes.  What increases the risk? The following factors may make you more likely to experience migraine headaches:  Age. Risk increases with age.  Family history of migraine headaches.  Being Caucasian.  Depression and anxiety.  Obesity.  Being a woman.  Having a hole in the heart (patent foramen ovale) or other heart problems.  What are the signs or symptoms? The main symptom of this condition is pulsating or throbbing pain. Pain may:  Happen in any area of the head, such as on one side or both sides.  Interfere with daily activities.  Get worse with physical activity.  Get worse with exposure to bright lights or loud noises.  Other symptoms may include:  Nausea.  Vomiting.  Dizziness.  General sensitivity to bright lights, loud noises, or smells.  Before you get a migraine, you may get warning signs that a migraine is  developing (aura). An aura may include:  Seeing flashing lights or having blind spots.  Seeing bright spots, halos, or zigzag lines.  Having tunnel vision or blurred vision.  Having numbness or a tingling feeling.  Having trouble talking.  Having muscle weakness.  How is this diagnosed? A migraine headache can be diagnosed based on:  Your symptoms.  A physical exam.  Tests, such as CT scan or MRI of the head. These imaging tests can help rule out other causes of headaches.  Taking fluid from the spine (lumbar puncture) and analyzing it (cerebrospinal fluid analysis, or CSF analysis).  How is this treated? A migraine headache is usually treated with medicines that:  Relieve pain.  Relieve nausea.  Prevent migraines from coming back.  Treatment may also include:  Acupuncture.  Lifestyle changes like avoiding foods that trigger migraines.  Follow these instructions at home: Medicines  Take over-the-counter and prescription medicines only as told by your health care provider.  Do not drive or use heavy machinery while taking prescription pain medicine.  To prevent or treat constipation while you are taking prescription pain medicine, your health care provider may recommend that you: ? Drink enough fluid to keep your urine clear or pale yellow. ? Take over-the-counter or prescription medicines. ? Eat foods that are high in fiber, such as fresh fruits and vegetables, whole grains, and beans. ? Limit foods that are high in fat and processed sugars, such as fried and sweet foods. Lifestyle  Avoid alcohol use.  Do not use any products that contain  nicotine or tobacco, such as cigarettes and e-cigarettes. If you need help quitting, ask your health care provider.  Get at least 8 hours of sleep every night.  Limit your stress. General instructions   Keep a journal to find out what may trigger your migraine headaches. For example, write down: ? What you eat and  drink. ? How much sleep you get. ? Any change to your diet or medicines.  If you have a migraine: ? Avoid things that make your symptoms worse, such as bright lights. ? It may help to lie down in a dark, quiet room. ? Do not drive or use heavy machinery. ? Ask your health care provider what activities are safe for you while you are experiencing symptoms.  Keep all follow-up visits as told by your health care provider. This is important. Contact a health care provider if:  You develop symptoms that are different or more severe than your usual migraine symptoms. Get help right away if:  Your migraine becomes severe.  You have a fever.  You have a stiff neck.  You have vision loss.  Your muscles feel weak or like you cannot control them.  You start to lose your balance often.  You develop trouble walking.  You faint. This information is not intended to replace advice given to you by your health care provider. Make sure you discuss any questions you have with your health care provider. Document Released: 06/09/2005 Document Revised: 12/28/2015 Document Reviewed: 11/26/2015 Elsevier Interactive Patient Education  2017 [employer]

## 2017-04-16 NOTE — Progress Notes (Signed)
HPI:                                                                Gwendolyn Bowen is a 36 y.o. female who presents to [facility] today for migraine  Onset: 3 days ago Location: right frontotemporal Character: feels like typical migraine Duration: waxing and waning Associated photophobia, phonophobia nausea No aura, No acute vision change Treatments tried: Tylenol 1500 mg, ran out of Sumatriptan Reports 2 headaches per month, but they are causing absences from work. Reports she missed work yesterday due to headache. Requesting work note today.  Past Medical History:  Diagnosis Date  . Arthritis   . Depression   . Heart murmur   . Migraines    Past Surgical History:  Procedure Laterality Date  . ABDOMINAL HYSTERECTOMY    . DILATATION & CURETTAGE/HYSTEROSCOPY WITH TRUECLEAR     Social History  Substance Use Topics  . Smoking status: Never Smoker  . Smokeless tobacco: Never Used  . Alcohol use No   family history includes Bell's palsy in her mother; Cancer in her mother; Clotting disorder in her mother; Hypertension in her mother; Rheum arthritis in her mother.  ROS: negative except as noted in the HPI  Medications: Current Outpatient Prescriptions  Medication Sig Dispense Refill  . albuterol (PROVENTIL HFA;VENTOLIN HFA) 108 (90 Base) MCG/ACT inhaler Inhale 1 puff into the lungs every 4 (four) hours as needed for wheezing. 1 Inhaler 0  . escitalopram (LEXAPRO) 20 MG tablet Take 1 tablet (20 mg total) by mouth daily. 30 tablet 5  . phentermine 15 MG capsule Take 1 capsule (15 mg total) by mouth every morning. 30 capsule 1  . topiramate (TOPAMAX) 50 MG tablet Take 1.5 tablets (75 mg total) by mouth at bedtime. 45 tablet 1   No current facility-administered medications for this visit.    Allergies  Allergen Reactions  . Codeine Anaphylaxis  . Nsaids Anaphylaxis  . Aleve [Naproxen Sodium]   . Amoxicillin     Tolerates  cephalosporins  . Asa [Aspirin]   . Ibuprofen   . Penicillins   . Sulfa Antibiotics        Objective:  BP (!) 103/56   Pulse 79   Wt 147 lb (66.7 kg)   LMP 04/27/2014   BMI 26.04 kg/m  Gen: well-groomed, not ill-appearing, no acute distress HEENT: head normocephalic, atraumatic; conjunctiva and cornea clear, oropharynx clear, moist mucus membranes Pulm: Normal work of breathing, normal phonation Neuro:  cranial nerves II-XII intact, no nystagmus, no papilledema, normal finger-to-nose, normal heel-to-shin, negative pronator drift, normal coordination, normal tone, no tremor MSK: strength 5/5 and symmetric in bilateral upper and lower extremities, normal gait and station, negative Romberg Mental Status: alert and oriented x 3, speech articulate, and thought processes clear and goal-directed   No results found for this or any previous visit (from the past 72 hour(s)). No results found.  Depression screen [facility] 2/9 02/26/2017 01/01/2017 12/09/2016 09/15/2016 04/07/2016  Decreased Interest 0 0 0 1 2  Down, Depressed, Hopeless 2 1 1 3 2   PHQ - 2 Score 2 1 1 4 4   Altered sleeping 1 3 3 3 3   Tired, decreased energy 3 3 3 3 3   Change in  appetite 1 1 3 2 3   Feeling bad or failure about yourself  0 0 0 2 3  Trouble concentrating 0 0 2 0 2  Moving slowly or fidgety/restless 0 1 2 0 2  Suicidal thoughts 0 0 0 0 0  PHQ-9 Score 7 9 14 14 20      Assessment and Plan: 36 y.o. female with   1. Recurrent major depressive disorder, in partial remission (HCC) - PHQ9 score 7, stable on Lexapro. Follow-up in 6 months - escitalopram (LEXAPRO) 20 MG tablet; Take 1 tablet (20 mg total) by mouth daily.  Dispense: 30 tablet; Refill: 5  2. Migraine without aura and without status migrainosus, not intractable - reassuring neuro exam, no focal deficits - migraine aborted in office with Decadron and Toradol - patient given Sumatriptan and Phenergan for abortive therapy for future headaches - she is on  Topamax for weight loss. We will increase to 50mg  bid for migraine prophylaxis - work note provided   - SUMAtriptan 6 MG/0.5ML SOAJ; 6 mg SQ once as needed for migraine, may repeat once in 1 hour  Dispense: 10 Cartridge; Refill: 1 - topiramate (TOPAMAX) 50 MG tablet; Take 1 tablet (50 mg total) by mouth 2 (two) times daily.  Dispense: 60 tablet; Refill: 1 - promethazine (PHENERGAN) 25 MG tablet; Take 0.5-1 tablets (12.5-25 mg total) by mouth every 6 (six) hours as needed for nausea or vomiting.  Dispense: 30 tablet; Refill: 2 - dexamethasone (DECADRON) injection 4 mg; Inject 1 mL (4 mg total) into the muscle once. - ketorolac (TORADOL) 30 MG/ML injection 30 mg; Inject 1 mL (30 mg total) into the muscle once.   Patient education and anticipatory guidance given Patient agrees with treatment plan Follow-up in 8 weeks for migraines or sooner as needed if symptoms worsen or fail to improve  Levonne Hubertharley E. Cummings PA-C

## 2017-04-23 ENCOUNTER — Ambulatory Visit (INDEPENDENT_AMBULATORY_CARE_PROVIDER_SITE_OTHER): Payer: Medicaid Other | Admitting: Licensed Clinical Social Worker

## 2017-04-23 ENCOUNTER — Encounter (HOSPITAL_COMMUNITY): Payer: Self-pay | Admitting: Licensed Clinical Social Worker

## 2017-04-23 DIAGNOSIS — F418 Other specified anxiety disorders: Secondary | ICD-10-CM | POA: Diagnosis not present

## 2017-04-23 NOTE — Progress Notes (Signed)
   THERAPIST PROGRESS NOTE  Session Time: 3:10-4pm  Participation Level: Active  Behavioral Response: NeatAlertAnxious   Type of Therapy: Individual Therapy  Treatment Goals addressed: Coping  Interventions: CBT  Summary: Gwendolyn Bowen is a 36 y.o. female.Pt discussed her psychiatric symptoms and current life events. Pt reports her anxiety and depression symptoms have increased. She has an appt with Dr. Rene KocherEksir in about a month. Today was the first day of work with a new company. Pt was anxious but used her breathing exercises before she walked in the building. Used immediate feedback with pt. Pt still continues to look for new employment. Pt still continues to work on the relationship with her sister. Used empathic reflection with pt in her struggle. Pt still struggles with her self esteem. Went over worksheet on the development of poor self esteem. Pt was oen to the discussion.       Suicidal/Homicidal: Nowithout intent/plan  Therapist Response: Assessed pt'Bowen current functioning and reviewed progress.  Assisted pt with empathic reflection, immediate feedback, self esteem  and processing for the management of her stressors.  Plan: Return again in 2 weeks. Continue working on self esteem.   Diagnosis: Axis I: Depression with anxiety      Gwendolyn Bowen, LCAS  04/23/17

## 2017-05-07 ENCOUNTER — Encounter (HOSPITAL_COMMUNITY): Payer: Self-pay | Admitting: Licensed Clinical Social Worker

## 2017-05-07 ENCOUNTER — Ambulatory Visit (INDEPENDENT_AMBULATORY_CARE_PROVIDER_SITE_OTHER): Payer: Medicaid Other | Admitting: Licensed Clinical Social Worker

## 2017-05-07 DIAGNOSIS — F418 Other specified anxiety disorders: Secondary | ICD-10-CM

## 2017-05-07 NOTE — Progress Notes (Signed)
   THERAPIST PROGRESS NOTE  Session Time: 3:10-4pm  Participation Level: Active  Behavioral Response: NeatAlertAnxious   Type of Therapy: Individual Therapy  Treatment Goals addressed: Coping  Interventions: CBT/MI  Summary: Gwendolyn RadarSally R Gunnells is a 36 y.o. female.Pt discussed her psychiatric symptoms and current life events. Pt reports her anxiety and depression symptoms have increased. She has an appt with Dr. Rene KocherEksir next week. 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 She is still looking for new employment opportunities. Pt lacks insight into her anxiety and patterns of dysfunctional behavior. Used MI and CBT to help build rational supportive thinking.  Assisted in building hope and strengthen resiliency.     Suicidal/Homicidal: Nowithout intent/plan  Therapist Response: Assessed pt's current functioning and reviewed progress.  Assisted pt with MI and CBT techniques, building hope and strengthen resiliency.   Plan: Return again in 2 weeks.    Diagnosis: Axis I: Depression with anxiety      MACKENZIE,LISBETH S, LCAS  05/07/17

## 2017-05-11 ENCOUNTER — Telehealth: Payer: Self-pay | Admitting: *Deleted

## 2017-05-11 NOTE — Telephone Encounter (Signed)
Received PA for sumatriptan dated 04/20/17 from [facility]. Called patient's insurance and Medicaid states a PA is not needed. A rejection notice was probably issued because the patient can only get 12 per month.[facility] did not have any record of patient having tried to refill this med recently and the rx is still there on hold. A note was put into their system lettig them know the patient can only get 12 per month for future reference.acm

## 2017-05-13 ENCOUNTER — Ambulatory Visit (HOSPITAL_COMMUNITY): Payer: Self-pay | Admitting: Psychiatry

## 2017-05-21 ENCOUNTER — Ambulatory Visit (INDEPENDENT_AMBULATORY_CARE_PROVIDER_SITE_OTHER): Payer: Medicaid Other | Admitting: Licensed Clinical Social Worker

## 2017-05-21 ENCOUNTER — Encounter (HOSPITAL_COMMUNITY): Payer: Self-pay | Admitting: Licensed Clinical Social Worker

## 2017-05-21 DIAGNOSIS — F418 Other specified anxiety disorders: Secondary | ICD-10-CM

## 2017-05-21 NOTE — Progress Notes (Signed)
   THERAPIST PROGRESS NOTE  Session Time: 3:10-4pm  Participation Level: Active  Behavioral Response: NeatAlertAnxious   Type of Therapy: Individual Therapy  Treatment Goals addressed: Coping  Interventions: CBT/MI  Summary: Gwendolyn Bowen is a 36 y.o. female.Pt discussed her psychiatric symptoms and current life events. Pt was tearful during her session. She admits that her depressive and anxiety symptoms have increased. She took herself off the Lexapro because she didn't think it was working and sexual dysfunction. Pt had to cancel her appt with the psychiatrist and does not have one until 2/19. She is going to try to see her PCP to get back on some sort of anti-depressant. Pt reports she is irritable and snappy. Discussed benefits of medication. Pt still has stressors: job, children, home life,x husband. Pt continues to use her coping tools of mindfulness activities that assist with anxiety. Pt continues to lack insight into her anxiety and patterns of dysfunctional behavior. Used MI and CBT to help build rational supportive thinking.      Suicidal/Homicidal: Nowithout intent/plan  Therapist Response: Assessed pt's current functioning and reviewed progress.  Assisted pt with MI and CBT techniques,   Plan: Return again in 2 weeks.    Diagnosis: Axis I: Depression with anxiety      MACKENZIE,LISBETH S, LCAS  05/21/17

## 2017-05-25 ENCOUNTER — Ambulatory Visit (INDEPENDENT_AMBULATORY_CARE_PROVIDER_SITE_OTHER): Payer: Medicaid Other | Admitting: Physician Assistant

## 2017-05-25 ENCOUNTER — Encounter: Payer: Self-pay | Admitting: Physician Assistant

## 2017-05-25 VITALS — BP 111/79 | HR 91 | Temp 98.0°F | Wt 146.0 lb

## 2017-05-25 DIAGNOSIS — G43001 Migraine without aura, not intractable, with status migrainosus: Secondary | ICD-10-CM | POA: Diagnosis not present

## 2017-05-25 MED ORDER — DEXAMETHASONE SODIUM PHOSPHATE 10 MG/ML IJ SOLN: 10.0000 mg | Freq: Once | INTRAMUSCULAR | Status: DC

## 2017-05-25 MED ORDER — DEXAMETHASONE SODIUM PHOSPHATE 10 MG/ML IJ SOLN
4.0000 mg | Freq: Once | INTRAMUSCULAR | Status: AC
  Administered 2017-05-25: 4 mg via INTRAMUSCULAR

## 2017-05-25 MED ORDER — SUMATRIPTAN SUCCINATE 6 MG/0.5ML ~~LOC~~ SOLN
6.0000 mg | Freq: Once | SUBCUTANEOUS | Status: AC
  Administered 2017-05-25: 6 mg via SUBCUTANEOUS

## 2017-05-25 MED ORDER — SUMATRIPTAN SUCCINATE 6 MG/0.5ML ~~LOC~~ SOAJ: SUBCUTANEOUS | 1 refills | Status: DC

## 2017-05-25 MED ORDER — PROPRANOLOL HCL 20 MG PO TABS: 20.0000 mg | ORAL_TABLET | Freq: Two times a day (BID) | ORAL | 3 refills | Status: DC

## 2017-05-25 MED ORDER — PROMETHAZINE HCL 25 MG/ML IJ SOLN
12.5000 mg | Freq: Four times a day (QID) | INTRAMUSCULAR | Status: DC | PRN
  Administered 2017-05-25: 12.5 mg via INTRAMUSCULAR

## 2017-05-25 MED ORDER — PROMETHAZINE HCL 25 MG/ML IJ SOLN: 12.5000 mg | Freq: Four times a day (QID) | INTRAMUSCULAR | Status: DC | PRN

## 2017-05-25 NOTE — Patient Instructions (Addendum)
-   Stop Tylenol  - Start Propranolol - 1 tab twice a day for migraine prophylaxis - Imitrex as needed: do not exceed 2 doses in 24 hours, do not use more than 3 times per week - Continue Phenergan as needed for nausea - Stay hydrated. Do not skip meals. Get at least 7-9 hours of sleep per night    Recurrent Migraine Headache A migraine headache is very bad, throbbing pain that is usually on one side of your head. Recurrent migraines keep coming back (recurring). Talk with your doctor about what things may bring on (trigger) your migraine headaches. Follow these instructions at home: Medicines  Take over-the-counter and prescription medicines only as told by your doctor.  Do not drive or use heavy machinery while taking prescription pain medicine. Lifestyle  Do not use any products that contain nicotine or tobacco, such as cigarettes and e-cigarettes. If you need help quitting, ask your doctor.  Limit alcohol intake to no more than 1 drink a day for nonpregnant women and 2 drinks a day for men. One drink equals 12 oz of beer, 5 oz of wine, or 1 oz of hard liquor.  Get 7-9 hours of sleep each night.  Lessen any stress in your life. Ask your doctor about ways to lower your stress.  Stay at a healthy weight. Talk with your doctor if you need help losing weight.  Get regular exercise. General instructions  Keep a journal to find out if certain things bring on migraine headaches. For example, write down: ? What you eat and drink. ? How much sleep you get. ? Any change to your diet or medicines.  Lie down in a dark, quiet room when you have a migraine.  Try placing a cool towel over your head when you have a migraine.  Keep lights dim if bright lights bother you or make your migraines worse.  Keep all follow-up visits as told by your doctor. This is important. Contact a doctor if:  Medicine does not help your migraines.  Your pain keeps coming back.  You have a fever.  You  have weight loss without trying. Get help right away if:  Your migraine becomes really bad and medicine does not help.  You have a stiff neck.  You have trouble seeing.  Your muscles are weak or you lose control of your muscles.  You lose your balance or have trouble walking.  You feel like you will pass out (faint) or you pass out.  You have really bad symptoms that are different than your first symptoms.  You start having sudden, very bad headaches that last for one second or less, like a thunderclap. Summary  A migraine headache is very bad, throbbing pain that is usually on one side of your head.  Talk with your doctor about what things may bring on (trigger) your migraine headaches.  Take over-the-counter and prescription medicines only as told by your doctor.  Lie down in a dark, quiet room when you have a migraine.  Keep a journal about what you eat and drink, how much sleep you get, and any changes to your medicines. This can help you find out if certain things make you have migraine headaches. This information is not intended to replace advice given to you by your health care provider. Make sure you discuss any questions you have with your health care provider. Document Released: 03/18/2008 Document Revised: 05/02/2016 Document Reviewed: 05/02/2016 Elsevier Interactive Patient Education  2017 [employer]

## 2017-05-25 NOTE — Progress Notes (Signed)
HPI:                                                                Gwendolyn Bowen is a 36 y.o. female who [facility] today for migraine headache  Pleasant 36 yo F with PMH of migraines w/o aura, anxiety and depression presents today with persistent daily migraine for the last month. Reports bilateral frontal temporal headache, pulsating, worse with activity. Pain is moderate-severe and interfering with daily activities. There is associated dizziness, phonophobia, photophobia and nausea. She has missed multiple days of work, including today, and has had to leave work early on multiple days. She denies vision change, paresthesias, focal weakness, gait disturbance or LOC. She has been taking Tylenol 1500 mg twice a day most days. She is also on Topamax 100 mg QHS. She never filled her Imitrex prescription. She has a history of multiple negative head CT's.  She self-discontinued her Lexapro 4 weeks ago due to irritability.  Past Medical History:  Diagnosis Date  . Arthritis   . Depression   . Heart murmur   . Migraines    Past Surgical History:  Procedure Laterality Date  . ABDOMINAL HYSTERECTOMY    . DILATATION & CURETTAGE/HYSTEROSCOPY WITH TRUECLEAR     Social History   Tobacco Use  . Smoking status: Never Smoker  . Smokeless tobacco: Never Used  Substance Use Topics  . Alcohol use: No   family history includes Bell's palsy in her mother; Cancer in her mother; Clotting disorder in her mother; Hypertension in her mother; Rheum arthritis in her mother.  ROS: negative except as noted in the HPI  Medications: Current Outpatient Medications  Medication Sig Dispense Refill  . albuterol (PROVENTIL HFA;VENTOLIN HFA) 108 (90 Base) MCG/ACT inhaler Inhale 1 puff into the lungs every 4 (four) hours as needed for wheezing. 1 Inhaler 0  . phentermine 15 MG capsule Take 1 capsule (15 mg total) by mouth every morning. 30 capsule 1   . promethazine (PHENERGAN) 25 MG tablet Take 0.5-1 tablets (12.5-25 mg total) by mouth every 6 (six) hours as needed for nausea or vomiting. 30 tablet 2  . SUMAtriptan 6 MG/0.5ML SOAJ 6 mg SQ once as needed for migraine, may repeat once in 1 hour 10 Cartridge 1  . topiramate (TOPAMAX) 50 MG tablet Take 1 tablet (50 mg total) by mouth 2 (two) times daily. 60 tablet 1  . escitalopram (LEXAPRO) 20 MG tablet Take 1 tablet (20 mg total) by mouth daily. (Patient not taking: Reported on 05/25/2017) 30 tablet 5   No current facility-administered medications for this visit.    Allergies  Allergen Reactions  . Codeine Anaphylaxis  . Nsaids Anaphylaxis  . Aleve [Naproxen Sodium]   . Amoxicillin     Tolerates cephalosporins  . Asa [Aspirin]   . Ibuprofen   . Penicillins   . Sulfa Antibiotics        Objective:  BP 111/79   Pulse 91   Temp 98 F (36.7 C) (Oral)   Wt 146 lb (66.2 kg)   LMP 04/27/2014   SpO2 100%   BMI 25.86 kg/m  Gen: well-groomed, ill-appearing, not toxic-appearing, no acute distress HEENT: head normocephalic, atraumatic; conjunctiva and cornea clear, oropharynx clear,  moist mucus membranes Pulm: Normal work of breathing, normal phonation Neuro:  cranial nerves II-XII intact, no nystagmus, normal finger-to-nose, normal heel-to-shin, negative pronator drift, normal rapid alternating movements, DTR's intact, normal tone, no tremor MSK: strength 5/5 and symmetric in bilateral upper and lower extremities, normal gait and station, negative Romberg Mental Status: alert and oriented x 3, speech articulate, and thought processes clear and goal-directed    [facility] 2/9 05/25/2017 02/26/2017 01/01/2017 12/09/2016 09/15/2016  Decreased Interest 1 0 0 0 1  Down, Depressed, Hopeless 2 2 1 1 3   PHQ - 2 Score 3 2 1 1 4   Altered sleeping 3 1 3 3 3   Tired, decreased energy 3 3 3 3 3   Change in appetite 2 1 1 3 2   Feeling bad or failure about yourself  2 0 0 0 2  Trouble  concentrating 2 0 0 2 0  Moving slowly or fidgety/restless 3 0 1 2 0  Suicidal thoughts 0 0 0 0 0  PHQ-9 Score 18 7 9 14 14     GAD 7 : Generalized Anxiety Score 05/25/2017 05/25/2017 01/01/2017 12/09/2016  Nervous, Anxious, on Edge 3 3 2 1   Control/stop worrying 3 3 1  0  Worry too much - different things 3 3 1 1   Trouble relaxing 1 1 1 1   Restless 0 0 0 2  Easily annoyed or irritable 3 3 1 1   Afraid - awful might happen 0 0 0 0  Total GAD 7 Score 13 13 6 6   Anxiety Difficulty - - - -     No results found for this or any previous visit (from the past 72 hour(s)). No results found.  CT Brain Head Wo Contrast8/07/2013 [facility] Result Impression  IMPRESSION: Mild paranasal sinus disease, age-indeterminate. Otherwise, negative acute.  Result Narrative  INDICATION: Headache 784.00.  TECHNIQUE:Multiple axial CT images obtained from the skull base to vertex without IV contrast.  FINDINGS:  No CT evidence of acute territorial infarct. No acute intracranial hemorrhage. No other acute brain abnormality (such as hydrocephalus, mass effect, midline shift, or mass lesion). There is mild mucosal thickening in the sphenoid sinus and partial opacification of bilateral ethmoid air cells. Remainder of paranasal sinuses and the bilateral mastoids are clear. Osseous structures unremarkable.     Assessment and Plan: 36 y.o. female with   1. Migraine without aura and with status migrainosus, not intractable - no focal deficits on exam, long-standing history of migraine. Suspect there is also a component of medication overuse headache, given the daily high dose Tylenol. She was instructed to stop Tylenol. Patient given migraine cocktail of Decadron, Sumatirptan and Phenergan IM in the office today. Started on Propranolol bid for chronic migraine. Referring to neurology for second opinion. Close follow-up in 2 weeks.  - SUMAtriptan 6 MG/0.5ML SOAJ; 6 mg SQ once as needed for migraine,  may repeat once in 1 hour  Dispense: 10 Cartridge; Refill: 1 - Ambulatory referral to Neurology - propranolol (INDERAL) 20 MG tablet; Take 1 tablet (20 mg total) by mouth 2 (two) times daily.  Dispense: 60 tablet; Refill: 3 - SUMAtriptan (IMITREX) injection 6 mg - dexamethasone (DECADRON) injection 4 mg - promethazine (PHENERGAN) injection 12.5 mg   Patient education and anticipatory guidance given Patient agrees with treatment plan Follow-up in 2 weeks or sooner as needed if symptoms worsen or fail to improve  Levonne Hubertharley E. Cummings PA-C

## 2017-05-26 ENCOUNTER — Encounter: Payer: Self-pay | Admitting: Physician Assistant

## 2017-05-29 ENCOUNTER — Ambulatory Visit: Payer: Self-pay | Admitting: Physician Assistant

## 2017-06-04 ENCOUNTER — Ambulatory Visit (INDEPENDENT_AMBULATORY_CARE_PROVIDER_SITE_OTHER): Payer: Medicaid Other | Admitting: Licensed Clinical Social Worker

## 2017-06-04 DIAGNOSIS — F418 Other specified anxiety disorders: Secondary | ICD-10-CM | POA: Diagnosis not present

## 2017-06-04 NOTE — Progress Notes (Signed)
   THERAPIST PROGRESS NOTE  Session Time: 3:10-4pm  Participation Level: Active  Behavioral Response: NeatAlertAnxious   Type of Therapy: Individual Therapy  Treatment Goals addressed: Coping  Interventions: CBT/MI  Summary: Gwendolyn Bowen is a 36 y.o. female.Pt discussed her psychiatric symptoms and current life events. Pt reports her moods have increased. She is irritable most of the time. She has not seen a PCP to begin new meds and will not see her psychiatrist here until 2/19. Pt still has stressors: job, children, home life,x husband. Pt feels her coping skills are helping some with her anxiety and irritability. Taught pt emotional regulation skills to assist pt in formulating healthier thoughts. Pt completed worksheets in session on emotional regulation skills. Pt reports she is still receptive to the mindfulness practices.        Suicidal/Homicidal: Nowithout intent/plan  Therapist Response: Assessed pt's current functioning and reviewed progress.  Assisted pt  Processing healthy coping skills, emotional regulation skills and mindfulness practice.   Plan: Return again in 2 weeks.    Diagnosis: Axis I: Depression with anxiety      Taylor Spilde S, LCAS  06/04/17

## 2017-06-09 ENCOUNTER — Encounter (HOSPITAL_COMMUNITY): Payer: Self-pay | Admitting: Licensed Clinical Social Worker

## 2017-06-10 ENCOUNTER — Ambulatory Visit (INDEPENDENT_AMBULATORY_CARE_PROVIDER_SITE_OTHER): Payer: Medicaid Other | Admitting: Physician Assistant

## 2017-06-10 ENCOUNTER — Encounter: Payer: Self-pay | Admitting: Physician Assistant

## 2017-06-10 VITALS — BP 114/72 | HR 75 | Wt 148.0 lb

## 2017-06-10 DIAGNOSIS — F419 Anxiety disorder, unspecified: Secondary | ICD-10-CM

## 2017-06-10 DIAGNOSIS — Z62810 Personal history of physical and sexual abuse in childhood: Secondary | ICD-10-CM | POA: Diagnosis not present

## 2017-06-10 DIAGNOSIS — G43001 Migraine without aura, not intractable, with status migrainosus: Secondary | ICD-10-CM

## 2017-06-10 DIAGNOSIS — F331 Major depressive disorder, recurrent, moderate: Secondary | ICD-10-CM | POA: Diagnosis not present

## 2017-06-10 DIAGNOSIS — F329 Major depressive disorder, single episode, unspecified: Secondary | ICD-10-CM

## 2017-06-10 MED ORDER — PROPRANOLOL HCL 20 MG PO TABS: ORAL_TABLET | ORAL | 3 refills | Status: DC

## 2017-06-10 MED ORDER — ARIPIPRAZOLE 5 MG PO TABS: ORAL_TABLET | ORAL | 1 refills | Status: DC

## 2017-06-10 NOTE — Progress Notes (Signed)
HPI:                                                                Gwendolyn Bowen is a 36 y.o. female who [name] today for migraine and depression follow-up  Migraine: patient was seen on 05/25/17 for acute migraine with status migrainous. Migraines had increased in frequency and severity of that month and patient was missing multiple days of work. She was using high-doses of acetaminophen daily. She was started on migraine prophylaxis with Propranolol and instructed to stop all OTC pain relievers to prevent medication overuse headache. She presents today for 2 week follow-up. Her migraine was aborted with migraine cocktail. Unfortunately, due to an education issue, patient never started her Propranolol. She has continued to have multiple migraines per week and is not using any abortive therapy, even though she is prescribed Sumatriptan and Promethazine.  Depression/Anxiety: self-discontinued her Lexapro 6 weeks ago because she felt it was not helping. She is currently in counseling and has been going consistently for the past year, which is helping. Patient states she never told me in the past that she has "bipolar diagnosed at age 89." She states her bipolar causes her to have mood fluctuations throughout the day and feel irritable. Additionally she has fatigue, difficulty concentrating, restlessness, excessive worry and anxiety. She denies symptoms of hypomania/mania, including impulsivity and euphoria. Prior meds: Effexor, Cymbalta, Sertraline, Celexa, Lexapro, Wellbutrin  Past Medical History:  Diagnosis Date  . Arthritis   . Depression   . Heart murmur   . Migraines    Past Surgical History:  Procedure Laterality Date  . ABDOMINAL HYSTERECTOMY    . DILATATION & CURETTAGE/HYSTEROSCOPY WITH TRUECLEAR     Social History   Tobacco Use  . Smoking status: Never Smoker  . Smokeless tobacco: Never Used  Substance Use Topics   . Alcohol use: No   family history includes Bell's palsy in her mother; Cancer in her mother; Clotting disorder in her mother; Hypertension in her mother; Rheum arthritis in her mother.  ROS: negative except as noted in the HPI  Medications: Current Outpatient Medications  Medication Sig Dispense Refill  . albuterol (PROVENTIL HFA;VENTOLIN HFA) 108 (90 Base) MCG/ACT inhaler Inhale 1 puff into the lungs every 4 (four) hours as needed for wheezing. 1 Inhaler 0  . phentermine 15 MG capsule Take 1 capsule (15 mg total) by mouth every morning. 30 capsule 1  . promethazine (PHENERGAN) 25 MG tablet Take 0.5-1 tablets (12.5-25 mg total) by mouth every 6 (six) hours as needed for nausea or vomiting. 30 tablet 2  . propranolol (INDERAL) 20 MG tablet Take 1 tablet (20 mg total) by mouth 2 (two) times daily. 60 tablet 3  . SUMAtriptan 6 MG/0.5ML SOAJ 6 mg SQ once as needed for migraine, may repeat once in 1 hour 10 Cartridge 1  . topiramate (TOPAMAX) 50 MG tablet Take 1 tablet (50 mg total) by mouth 2 (two) times daily. 60 tablet 1   Current Facility-Administered Medications  Medication Dose Route Frequency Provider Last Rate Last Dose  . promethazine (PHENERGAN) injection 12.5 mg  12.5 mg Intramuscular Q6H PRN Carlis StableCummings, Tekia [place] Elizabeth, PA-C   12.5 mg at 05/25/17 1636   Allergies  Allergen Reactions  . Codeine Anaphylaxis  . Nsaids Anaphylaxis  . Aleve [Naproxen Sodium]   . Amoxicillin     Tolerates cephalosporins  . Asa [Aspirin]   . Ibuprofen   . Penicillins   . Sulfa Antibiotics        Objective:  LMP 04/27/2014  Gen:  alert, not ill-appearing, no distress, appropriate for age HEENT: head normocephalic without obvious abnormality, conjunctiva and cornea clear, trachea midline Pulm: Normal work of breathing, normal phonation  Neuro: alert and oriented x 3, no tremor MSK: extremities atraumatic, normal gait and station Skin: intact, no rashes on exposed skin, no jaundice, no  cyanosis Psych: well-groomed, cooperative, good eye contact, labile mood, affect mood-congruent, speech is articulate, and thought processes clear and goal-directed  Depression screen [facility] 2/9 05/25/2017 02/26/2017 01/01/2017 12/09/2016 09/15/2016  Decreased Interest 1 0 0 0 1  Down, Depressed, Hopeless 2 2 1 1 3   PHQ - 2 Score 3 2 1 1 4   Altered sleeping 3 1 3 3 3   Tired, decreased energy 3 3 3 3 3   Change in appetite 2 1 1 3 2   Feeling bad or failure about yourself  2 0 0 0 2  Trouble concentrating 2 0 0 2 0  Moving slowly or fidgety/restless 3 0 1 2 0  Suicidal thoughts 0 0 0 0 0  PHQ-9 Score 18 7 9 14 14    GAD 7 : Generalized Anxiety Score 06/10/2017 05/25/2017 05/25/2017 01/01/2017  Nervous, Anxious, on Edge 3 3 3 2   Control/stop worrying 2 3 3 1   Worry too much - different things 2 3 3 1   Trouble relaxing 1 1 1 1   Restless 1 0 0 0  Easily annoyed or irritable 3 3 3 1   Afraid - awful might happen 0 0 0 0  Total GAD 7 Score 12 13 13 6   Anxiety Difficulty Very difficult - - -     No results found for this or any previous visit (from the past 72 hour(s)). No results found.    Assessment and Plan: 36 y.o. female with   Moderate episode of recurrent major depressive disorder (HCC) - PHQ9 score 18, moderately severe; no acute safety issues - the symptoms described are more consistent with mood lability than bipolar disorder. I do not feel she meets clinical criteria for bipolar depression. Symptoms are most consistent with unipolar depression with comorbid anxiety - given that she has failed multiple SSRI's and SNRI's, I am going to trial her on Abilify 5 mg at bedtime and titrate to therapeutic dose  Anxiety and depression - GAD7 score 12, moderate - continue CBT  Migraine without aura and with status migrainosus, not intractable - re-educated patient today on starting prophylaxis with Propranolol and proper use of abortive therapy with triptan. Patient expressed understanding -  I will approve patient for intermittent FMLA for her migraines. She will need to drop off the paperwork   Has failed multiple SSRI's and SNRI's (zoloft, lexapro, celexa, effexor, cymablta) Patient education and anticipatory guidance given Patient agrees with treatment plan Follow-up in 4 weeks or sooner as needed if symptoms worsen or fail to improve  Levonne Hubertharley E. Tramaine Snell PA-C

## 2017-06-10 NOTE — Patient Instructions (Addendum)
For migraines: - Start Propranolol - 1 tab twice a day for migraine prophylaxis. After 2 weeks, if still having more than 1 headache per week, increase to 2 tablets (40mg ) twice a day  - Imitrex as needed: do not exceed 2 doses in 24 hours, do not use more than 3 times per week - Continue Phenergan as needed for nausea - Stay hydrated. Do not skip meals. Get at least 7-9 hours of sleep per night  For mood: - start Abilify 1 tablet, 30 minutes before bedtime - after 1 week, increase to 2 tablets

## 2017-06-18 ENCOUNTER — Ambulatory Visit (INDEPENDENT_AMBULATORY_CARE_PROVIDER_SITE_OTHER): Payer: Medicaid Other | Admitting: Licensed Clinical Social Worker

## 2017-06-18 DIAGNOSIS — F418 Other specified anxiety disorders: Secondary | ICD-10-CM

## 2017-06-19 ENCOUNTER — Encounter (HOSPITAL_COMMUNITY): Payer: Self-pay | Admitting: Licensed Clinical Social Worker

## 2017-06-19 ENCOUNTER — Telehealth: Payer: Self-pay

## 2017-06-19 ENCOUNTER — Other Ambulatory Visit: Payer: Self-pay

## 2017-06-19 DIAGNOSIS — F331 Major depressive disorder, recurrent, moderate: Secondary | ICD-10-CM

## 2017-06-19 DIAGNOSIS — G43001 Migraine without aura, not intractable, with status migrainosus: Secondary | ICD-10-CM

## 2017-06-19 MED ORDER — ARIPIPRAZOLE 5 MG PO TABS: ORAL_TABLET | ORAL | 1 refills | Status: DC

## 2017-06-19 MED ORDER — ARIPIPRAZOLE 10 MG PO TABS: 5.0000 mg | ORAL_TABLET | Freq: Every day | ORAL | 1 refills | Status: DC

## 2017-06-19 MED ORDER — PROPRANOLOL HCL 20 MG PO TABS: ORAL_TABLET | ORAL | 3 refills | Status: DC

## 2017-06-19 NOTE — Progress Notes (Signed)
   THERAPIST PROGRESS NOTE  Session Time: 1:10-2pm  Participation Level: Active  Behavioral Response: NeatAlertAnxious   Type of Therapy: Individual Therapy  Treatment Goals addressed: Coping  Interventions: CBT/MI  Summary: Gwendolyn Bowen is a 36 y.o. female.Pt discussed her psychiatric symptoms and current life events. Pt reports her moods have become unstabilized. She continues to be irritable and anxious most of the time. She has not seen a PCP to begin new meds and will not see her psychiatrist here until 2/19. She is going to call her PCP today and try to get restarted on her Abilify before she sees the psychiatrist in February.  Pt still has stressors: job, children, home life,x husband. She is having behavior problems with her middle daughter who sees a therapist here in clinic. Pt feels her coping skills are helping minimally with her anxiety and irritability. Continue to teach pt emotional regulation skills to assist her in formulating healthier thoughts. Pt reports she is still trying to use the mindfulness practices.        Suicidal/Homicidal: Nowithout intent/plan  Therapist Response: Assessed pt's current functioning and reviewed progress.  Assisted pt  Processing healthy coping skills, emotional regulation skills and mindfulness practice.   Plan: Return again in 2 weeks.    Diagnosis: Axis I: Depression with anxiety      Avontae Burkhead S, LCAS  06/18/17

## 2017-06-19 NOTE — Telephone Encounter (Signed)
[facility] [place]

## 2017-06-19 NOTE — Telephone Encounter (Signed)
The pharmacy called and states the insurance will only cover the Abilify if it is written for 10 mg take 0.5 tablets for 7 days and then a whole tablet daily.   Order has been pended.

## 2017-06-21 ENCOUNTER — Encounter: Payer: Self-pay | Admitting: Physician Assistant

## 2017-06-21 DIAGNOSIS — Z62810 Personal history of physical and sexual abuse in childhood: Secondary | ICD-10-CM

## 2017-06-21 HISTORY — DX: Personal history of physical and sexual abuse in childhood: Z62.810

## 2017-06-29 ENCOUNTER — Ambulatory Visit (INDEPENDENT_AMBULATORY_CARE_PROVIDER_SITE_OTHER): Payer: Medicaid Other | Admitting: Licensed Clinical Social Worker

## 2017-06-29 ENCOUNTER — Encounter (HOSPITAL_COMMUNITY): Payer: Self-pay | Admitting: Licensed Clinical Social Worker

## 2017-06-29 DIAGNOSIS — F418 Other specified anxiety disorders: Secondary | ICD-10-CM
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
## 2017-07-03 ENCOUNTER — Telehealth: Payer: Self-pay | Admitting: Neurology

## 2017-07-03 ENCOUNTER — Telehealth: Payer: Self-pay

## 2017-07-03 ENCOUNTER — Ambulatory Visit: Payer: Medicaid Other | Admitting: Neurology

## 2017-07-03 NOTE — Telephone Encounter (Signed)
PT would like a [place] [facility] and was late so they refused to see her. This was her second late show, so they will not see her any more.

## 2017-07-08 ENCOUNTER — Encounter: Payer: Self-pay | Admitting: Neurology

## 2017-07-10 ENCOUNTER — Encounter: Payer: Self-pay | Admitting: Neurology

## 2017-07-13 ENCOUNTER — Ambulatory Visit (INDEPENDENT_AMBULATORY_CARE_PROVIDER_SITE_OTHER): Payer: Medicaid Other | Admitting: Licensed Clinical Social Worker

## 2017-07-13 DIAGNOSIS — F418 Other specified anxiety disorders: Secondary | ICD-10-CM | POA: Diagnosis not present

## 2017-07-14 ENCOUNTER — Encounter: Payer: Self-pay | Admitting: Physician Assistant

## 2017-07-14 ENCOUNTER — Encounter (HOSPITAL_COMMUNITY): Payer: Self-pay | Admitting: Licensed Clinical Social Worker

## 2017-07-14 ENCOUNTER — Ambulatory Visit (INDEPENDENT_AMBULATORY_CARE_PROVIDER_SITE_OTHER): Payer: Medicaid Other | Admitting: Physician Assistant

## 2017-07-14 VITALS — BP 118/77 | HR 78 | Wt 144.0 lb

## 2017-07-14 DIAGNOSIS — F411 Generalized anxiety disorder: Secondary | ICD-10-CM

## 2017-07-14 DIAGNOSIS — F331 Major depressive disorder, recurrent, moderate: Secondary | ICD-10-CM

## 2017-07-14 DIAGNOSIS — Z803 Family history of malignant neoplasm of breast: Secondary | ICD-10-CM

## 2017-07-14 DIAGNOSIS — G43009 Migraine without aura, not intractable, without status migrainosus: Secondary | ICD-10-CM | POA: Diagnosis not present

## 2017-07-14 DIAGNOSIS — Z1329 Encounter for screening for other suspected endocrine disorder: Secondary | ICD-10-CM | POA: Diagnosis not present

## 2017-07-14 DIAGNOSIS — Z1231 Encounter for screening mammogram for malignant neoplasm of breast: Secondary | ICD-10-CM

## 2017-07-14 DIAGNOSIS — Z79899 Other long term (current) drug therapy: Secondary | ICD-10-CM | POA: Diagnosis not present

## 2017-07-14 DIAGNOSIS — R5382 Chronic fatigue, unspecified: Secondary | ICD-10-CM | POA: Diagnosis not present

## 2017-07-14 DIAGNOSIS — Z1321 Encounter for screening for nutritional disorder: Secondary | ICD-10-CM

## 2017-07-14 DIAGNOSIS — Z130 Encounter for screening for diseases of the blood and blood-forming organs and certain disorders involving the immune mechanism: Secondary | ICD-10-CM | POA: Diagnosis not present

## 2017-07-14 DIAGNOSIS — Z90710 Acquired absence of both cervix and uterus: Secondary | ICD-10-CM | POA: Insufficient documentation

## 2017-07-14 DIAGNOSIS — Z8742 Personal history of other diseases of the female genital tract: Secondary | ICD-10-CM

## 2017-07-14 HISTORY — DX: Personal history of other diseases of the female genital tract: Z87.42

## 2017-07-14 MED ORDER — PROPRANOLOL HCL ER 80 MG PO CP24: 80.0000 mg | ORAL_CAPSULE | Freq: Every day | ORAL | 3 refills | Status: DC

## 2017-07-14 NOTE — Patient Instructions (Signed)
Recurrent Migraine Headache °A migraine headache is very bad, throbbing pain that is usually on one side of your head. Recurrent migraines keep coming back (recurring). Talk with your doctor about what things may bring on (trigger) your migraine headaches. °Follow these instructions at home: °Medicines  °· Take over-the-counter and prescription medicines only as told by your doctor. °· Do not drive or use heavy machinery while taking prescription pain medicine. °Lifestyle  °· Do not use any products that contain nicotine or tobacco, such as cigarettes and e-cigarettes. If you need help quitting, ask your doctor. °· Limit alcohol intake to no more than 1 drink a day for nonpregnant women and 2 drinks a day for men. One drink equals 12 oz of beer, 5 oz of wine, or 1½ oz of hard liquor. °· Get 7-9 hours of sleep each night. °· Lessen any stress in your life. Ask your doctor about ways to lower your stress. °· Stay at a healthy weight. Talk with your doctor if you need help losing weight. °· Get regular exercise. °General instructions  °· Keep a journal to find out if certain things bring on migraine headaches. For example, write down: °¨ What you eat and drink. °¨ How much sleep you get. °¨ Any change to your diet or medicines. °· Lie down in a dark, quiet room when you have a migraine. °· Try placing a cool towel over your head when you have a migraine. °· Keep lights dim if bright lights bother you or make your migraines worse. °· Keep all follow-up visits as told by your doctor. This is important. °Contact a doctor if: °· Medicine does not help your migraines. °· Your pain keeps coming back. °· You have a fever. °· You have weight loss without trying. °Get help right away if: °· Your migraine becomes really bad and medicine does not help. °· You have a stiff neck. °· You have trouble seeing. °· Your muscles are weak or you lose control of your muscles. °· You lose your balance or have trouble walking. °· You feel  like you will pass out (faint) or you pass out. °· You have really bad symptoms that are different than your first symptoms. °· You start having sudden, very bad headaches that last for one second or less, like a thunderclap. °Summary °· A migraine headache is very bad, throbbing pain that is usually on one side of your head. °· Talk with your doctor about what things may bring on (trigger) your migraine headaches. °· Take over-the-counter and prescription medicines only as told by your doctor. °· Lie down in a dark, quiet room when you have a migraine. °· Keep a journal about what you eat and drink, how much sleep you get, and any changes to your medicines. This can help you find out if certain things make you have migraine headaches. °This information is not intended to replace advice given to you by your health care provider. Make sure you discuss any questions you have with your health care provider. °Document Released: 03/18/2008 Document Revised: 05/02/2016 Document Reviewed: 05/02/2016 °Elsevier Interactive Patient Education © 2017 [employer] ° °

## 2017-07-14 NOTE — Progress Notes (Signed)
   THERAPIST PROGRESS NOTE  Session Time: 1:10-2pm  Participation Level: Active  Behavioral Response: NeatAlertAnxious   Type of Therapy: Individual Therapy  Treatment Goals addressed: Coping  Interventions: CBT/MI  Summary: Gwendolyn Bowen is a 37 y.o. female.Pt discussed her psychiatric symptoms and current life events.Pt presented tearful about her life. She does not feel well and there is a lot of chaos and dysfunction in her family life. She still remains unhappy in her job. Pt has appt with her PCP for lab tests. Discussed distress tolerance with pt to help facilitate appropriate responses to her external stimuli. Discussed locus of control as a framework for pt to understand her perception of the controlling factors within her life. Discussed internal and external locus of control. Used CBT to assist pt in reframing her thoughts and perceptions to external events.     Suicidal/Homicidal: Nowithout intent/plan  Therapist Response: Assessed pt's current functioning and reviewed progress.  Assisted pt  Processing healthy coping skills, emotional regulation skills, locus of control, cbt.Assisted pt processing for the management of her stressors.  Plan: Return again in 2 weeks.    Diagnosis: Axis I: Depression with anxiety      Patrizia Paule S, LCAS  07/13/17
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
## 2017-07-15 ENCOUNTER — Encounter: Payer: Self-pay | Admitting: Physician Assistant

## 2017-07-15 DIAGNOSIS — E559 Vitamin D deficiency, unspecified: Secondary | ICD-10-CM

## 2017-07-15 HISTORY — DX: Vitamin D deficiency, unspecified: E55.9

## 2017-07-15 LAB — COMPREHENSIVE METABOLIC PANEL
AG Ratio: 1.8 (calc) (ref 1.0–2.5)
ALKALINE PHOSPHATASE (APISO): 53 U/L (ref 33–115)
ALT: 11 U/L (ref 6–29)
AST: 12 U/L (ref 10–30)
Albumin: 4.6 g/dL (ref 3.6–5.1)
BUN: 14 mg/dL (ref 7–25)
CHLORIDE: 102 mmol/L (ref 98–110)
CO2: 28 mmol/L (ref 20–32)
CREATININE: 0.68 mg/dL (ref 0.50–1.10)
Calcium: 9.6 mg/dL (ref 8.6–10.2)
GLOBULIN: 2.5 g/dL (ref 1.9–3.7)
GLUCOSE: 96 mg/dL (ref 65–99)
POTASSIUM: 3.6 mmol/L (ref 3.5–5.3)
SODIUM: 137 mmol/L (ref 135–146)
Total Bilirubin: 0.7 mg/dL (ref 0.2–1.2)
Total Protein: 7.1 g/dL (ref 6.1–8.1)

## 2017-07-15 LAB — CBC
HCT: 37.2 % (ref 35.0–45.0)
Hemoglobin: 12.6 g/dL (ref 11.7–15.5)
MCH: 29.1 pg (ref 27.0–33.0)
MCHC: 33.9 g/dL (ref 32.0–36.0)
MCV: 85.9 fL (ref 80.0–100.0)
MPV: 10.4 fL (ref 7.5–12.5)
PLATELETS: 201 10*3/uL (ref 140–400)
RBC: 4.33 10*6/uL (ref 3.80–5.10)
RDW: 11.5 % (ref 11.0–15.0)
WBC: 5.7 10*3/uL (ref 3.8–10.8)

## 2017-07-15 LAB — C-REACTIVE PROTEIN: CRP: 0.3 mg/L (ref <8.0)

## 2017-07-15 LAB — VITAMIN D 25 HYDROXY (VIT D DEFICIENCY, FRACTURES): Vit D, 25-Hydroxy: 29 ng/mL — ABNORMAL LOW (ref 30–100)

## 2017-07-15 LAB — TSH: TSH: 1.51 mIU/L

## 2017-07-15 LAB — FERRITIN: FERRITIN: 92 ng/mL (ref 10–154)

## 2017-07-15 LAB — VITAMIN B12: Vitamin B-12: 517 pg/mL (ref 200–1100)

## 2017-07-15 LAB — SEDIMENTATION RATE: Sed Rate: 6 mm/h (ref 0–20)

## 2017-07-15 NOTE — Progress Notes (Signed)
Labs look great Recommend vitamin D3 1000 units daily for mild vitamin d insufficiency

## 2017-07-28 ENCOUNTER — Ambulatory Visit (INDEPENDENT_AMBULATORY_CARE_PROVIDER_SITE_OTHER): Payer: Medicaid Other | Admitting: Licensed Clinical Social Worker

## 2017-07-28 DIAGNOSIS — F4312 Post-traumatic stress disorder, chronic: Secondary | ICD-10-CM

## 2017-07-28 DIAGNOSIS — F332 Major depressive disorder, recurrent severe without psychotic features: Secondary | ICD-10-CM

## 2017-07-28 NOTE — Telephone Encounter (Signed)
No additional notes required  

## 2017-07-29 ENCOUNTER — Ambulatory Visit (INDEPENDENT_AMBULATORY_CARE_PROVIDER_SITE_OTHER): Payer: Medicaid Other

## 2017-07-29 ENCOUNTER — Encounter: Payer: Self-pay | Admitting: Physician Assistant

## 2017-07-29 ENCOUNTER — Encounter (HOSPITAL_COMMUNITY): Payer: Self-pay | Admitting: Licensed Clinical Social Worker

## 2017-07-29 ENCOUNTER — Ambulatory Visit (INDEPENDENT_AMBULATORY_CARE_PROVIDER_SITE_OTHER): Payer: Medicaid Other | Admitting: Psychiatry

## 2017-07-29 ENCOUNTER — Encounter (HOSPITAL_COMMUNITY): Payer: Self-pay | Admitting: Psychiatry

## 2017-07-29 VITALS — BP 126/74 | HR 69 | Ht 63.0 in | Wt 143.0 lb

## 2017-07-29 DIAGNOSIS — F4312 Post-traumatic stress disorder, chronic: Secondary | ICD-10-CM | POA: Diagnosis not present

## 2017-07-29 DIAGNOSIS — R928 Other abnormal and inconclusive findings on diagnostic imaging of breast: Secondary | ICD-10-CM

## 2017-07-29 DIAGNOSIS — F1099 Alcohol use, unspecified with unspecified alcohol-induced disorder: Secondary | ICD-10-CM | POA: Diagnosis not present

## 2017-07-29 DIAGNOSIS — Z62810 Personal history of physical and sexual abuse in childhood: Secondary | ICD-10-CM

## 2017-07-29 DIAGNOSIS — Z818 Family history of other mental and behavioral disorders: Secondary | ICD-10-CM | POA: Diagnosis not present

## 2017-07-29 DIAGNOSIS — Z803 Family history of malignant neoplasm of breast: Secondary | ICD-10-CM

## 2017-07-29 DIAGNOSIS — F422 Mixed obsessional thoughts and acts: Secondary | ICD-10-CM

## 2017-07-29 DIAGNOSIS — F332 Major depressive disorder, recurrent severe without psychotic features: Secondary | ICD-10-CM

## 2017-07-29 DIAGNOSIS — Z91411 Personal history of adult psychological abuse: Secondary | ICD-10-CM

## 2017-07-29 DIAGNOSIS — Z1231 Encounter for screening mammogram for malignant neoplasm of breast: Secondary | ICD-10-CM

## 2017-07-29 MED ORDER — CLOMIPRAMINE HCL 75 MG PO CAPS: 75.0000 mg | ORAL_CAPSULE | Freq: Two times a day (BID) | ORAL | 1 refills | Status: DC
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
## 2017-07-29 NOTE — Progress Notes (Signed)
   THERAPIST PROGRESS NOTE  Session Time: 3:10-4pm  Participation Level: Active  Behavioral Response: NeatAlertAnxious   Type of Therapy: Individual Therapy  Treatment Goals addressed: Coping  Interventions: CBT/MI  Summary: Gwendolyn Bowen is a 37 y.o. female.Pt discussed her psychiatric symptoms and current life events.Pt presented tearful today. She reports, "Im all over the place." She has an upcoming appt with a psychiatrist in the clinic.She is experiencing symptoms of :fidgety, restless, tired. Reminded pt the importance of keeping her psychiatric appt. Pt reports her household has calmed down after some hard discussion with her children. Worked with pt on her low frustration level, helping her to identify her negative core beliefs.to better identify her triggers. Encouraged pt to document her triggers when they occur and what is happening. Will continue to work with pt to increase insight, coping skills, increase frustration and distress tolerance.    Suicidal/Homicidal: Nowithout intent/plan  Therapist Response: Assessed pt'Bowen current functioning and reviewed progress.  Assisted pt  Processing distress tolerance skills for her low frustration tolerance. Work with pt to increase her insight, coping skills, increase frustration and distress tolerance. Assisted pt processing for the management of her stressors.  Plan: Return again in 2 weeks.    Diagnosis: Axis I: Chronic PTSD, Severe episode of recurrent MDD without psychotic features     Gwendolyn Bowen,Gwendolyn Bowen, LCAS  07/28/16

## 2017-07-29 NOTE — Patient Instructions (Signed)
Continue Abilify at a reduced dose of 5 mg daily until you see me  START Clomipramine 75 mg nightly for 7 days  THEN, increase to 75 mg twice a day (or take 150 mg at night)

## 2017-07-30 ENCOUNTER — Other Ambulatory Visit: Payer: Self-pay | Admitting: Physician Assistant

## 2017-07-30 DIAGNOSIS — R928 Other abnormal and inconclusive findings on diagnostic imaging of breast: Secondary | ICD-10-CM

## 2017-08-03 ENCOUNTER — Encounter (HOSPITAL_COMMUNITY): Payer: Self-pay | Admitting: Psychiatry

## 2017-08-03 ENCOUNTER — Telehealth (HOSPITAL_COMMUNITY): Payer: Self-pay | Admitting: Psychiatry

## 2017-08-04 ENCOUNTER — Ambulatory Visit (INDEPENDENT_AMBULATORY_CARE_PROVIDER_SITE_OTHER): Payer: Medicaid Other | Admitting: Psychiatry

## 2017-08-04 ENCOUNTER — Ambulatory Visit (HOSPITAL_COMMUNITY): Payer: Self-pay | Admitting: Psychiatry

## 2017-08-04 ENCOUNTER — Encounter (HOSPITAL_COMMUNITY): Payer: Self-pay | Admitting: Psychiatry

## 2017-08-04 DIAGNOSIS — Z79899 Other long term (current) drug therapy: Secondary | ICD-10-CM

## 2017-08-04 DIAGNOSIS — F4312 Post-traumatic stress disorder, chronic: Secondary | ICD-10-CM

## 2017-08-04 DIAGNOSIS — F422 Mixed obsessional thoughts and acts: Secondary | ICD-10-CM | POA: Diagnosis not present

## 2017-08-04 MED ORDER — CLOMIPRAMINE HCL 25 MG PO CAPS: 25.0000 mg | ORAL_CAPSULE | Freq: Two times a day (BID) | ORAL | 0 refills | Status: DC
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
## 2017-08-07 ENCOUNTER — Ambulatory Visit
Admission: RE | Admit: 2017-08-07 | Discharge: 2017-08-07 | Disposition: A | Discharge status: Home / Self Care | Payer: Medicaid Other | Source: Ambulatory Visit | Attending: Physician Assistant | Admitting: Physician Assistant

## 2017-08-07 DIAGNOSIS — R928 Other abnormal and inconclusive findings on diagnostic imaging of breast: Secondary | ICD-10-CM

## 2017-08-09 ENCOUNTER — Other Ambulatory Visit: Payer: Self-pay | Admitting: Physician Assistant

## 2017-08-09 DIAGNOSIS — Z9189 Other specified personal risk factors, not elsewhere classified: Secondary | ICD-10-CM

## 2017-08-09 DIAGNOSIS — Z1239 Encounter for other screening for malignant neoplasm of breast: Secondary | ICD-10-CM

## 2017-08-09 DIAGNOSIS — R928 Other abnormal and inconclusive findings on diagnostic imaging of breast: Secondary | ICD-10-CM

## 2017-08-09 NOTE — Progress Notes (Signed)
Hi Gwendolyn Bowen,  Your ultrasound and diagnostic mammogram looked good.  We are going to work on getting a breast MRI approved for you, which is recommended for women with increased risk for breast cancer.  You will still continue your annual mammograms.  Gwendolyn Bowen

## 2017-08-09 NOTE — Progress Notes (Signed)
m °

## 2017-08-13 ENCOUNTER — Ambulatory Visit (INDEPENDENT_AMBULATORY_CARE_PROVIDER_SITE_OTHER): Payer: Medicaid Other | Admitting: Licensed Clinical Social Worker

## 2017-08-13 ENCOUNTER — Encounter (HOSPITAL_COMMUNITY): Payer: Self-pay | Admitting: Licensed Clinical Social Worker

## 2017-08-13 DIAGNOSIS — F4312 Post-traumatic stress disorder, chronic: Secondary | ICD-10-CM

## 2017-08-13 NOTE — Progress Notes (Signed)
   THERAPIST PROGRESS NOTE  Session Time: 3:10-4pm  Participation Level: Active  Behavioral Response: NeatAlertAnxious   Type of Therapy: Individual Therapy  Treatment Goals addressed: Coping  Interventions: CBT/MI  Summary: Gwendolyn Bowen is a 37 y.o. female.Pt discussed her psychiatric symptoms and current life events.Pt presented tearful today.She has met with psychiatrist, Dr. Daron Offer, and is working with him on her meds. Taught pt how to identify SMART goals. She has so many ideas of things she wants in her life that she isn't mindful. Showed pt how to use strategies to meet her goals. Explained the purpose, process and practice of mindfulness techniques.Pt was receptive to the mindfulness practice. Suggested to pt to practice the techniques at home.     Suicidal/Homicidal: Nowithout intent/plan  Therapist Response: Assessed pt'Bowen current functioning and reviewed progress.  Assisted pt  Processing SMART goals and strategies. Assisted pt processing mindfulness practice.  Assisted pt processing for the management of her stressors.  Plan: Return again in 2 weeks.    Diagnosis: Axis I: Chronic PTSD,    Gwendolyn Bowen, LCAS  08/13/16

## 2017-08-27 ENCOUNTER — Ambulatory Visit (HOSPITAL_COMMUNITY): Payer: Self-pay | Admitting: Licensed Clinical Social Worker

## 2017-09-10 ENCOUNTER — Ambulatory Visit (HOSPITAL_COMMUNITY): Payer: Self-pay | Admitting: Licensed Clinical Social Worker

## 2017-09-24 ENCOUNTER — Ambulatory Visit (INDEPENDENT_AMBULATORY_CARE_PROVIDER_SITE_OTHER): Payer: Medicaid Other | Admitting: Psychiatry

## 2017-09-24 ENCOUNTER — Encounter (HOSPITAL_COMMUNITY): Payer: Self-pay | Admitting: Psychiatry

## 2017-09-24 DIAGNOSIS — F4312 Post-traumatic stress disorder, chronic: Secondary | ICD-10-CM | POA: Diagnosis not present

## 2017-09-24 DIAGNOSIS — F422 Mixed obsessional thoughts and acts: Secondary | ICD-10-CM

## 2017-09-24 MED ORDER — CLOMIPRAMINE HCL 50 MG PO CAPS: 50.0000 mg | ORAL_CAPSULE | Freq: Two times a day (BID) | ORAL | 0 refills | Status: DC

## 2017-09-24 MED ORDER — EPINEPHRINE 0.3 MG/0.3ML IJ SOAJ: 0.3000 mg | Freq: Once | INTRAMUSCULAR | 0 refills | Status: AC
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
## 2017-10-01 ENCOUNTER — Encounter (HOSPITAL_COMMUNITY): Payer: Self-pay | Admitting: Licensed Clinical Social Worker

## 2017-10-01 ENCOUNTER — Ambulatory Visit (INDEPENDENT_AMBULATORY_CARE_PROVIDER_SITE_OTHER): Payer: Medicaid Other | Admitting: Licensed Clinical Social Worker

## 2017-10-01 DIAGNOSIS — F4312 Post-traumatic stress disorder, chronic: Secondary | ICD-10-CM

## 2017-10-01 NOTE — Progress Notes (Signed)
   THERAPIST PROGRESS NOTE  Session Time: 3:10-4pm  Participation Level: Active  Behavioral Response: NeatAlertAnxious   Type of Therapy: Individual Therapy  Treatment Goals addressed: Coping  Interventions: CBT/MI  Summary: Gwendolyn Bowen is a 37 y.o. female.Pt discussed her psychiatric symptoms and current life events. Pt has not been to therapy in 6 weeks, discussed her continued buy in for therapy. Pt struggles with migraines and has missed appts. Discussed her appt with her psychiatrist, Dr. Rene KocherEksir. She reports he increased her medication and is having no side effects. 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 Pt reports her middle daughter is still difficult. Asked open ended questions and used empathic reflection.  Pt still struggles with self esteem. Discussed this with pt And went over worksheet. Will continue this at next session. Continue to encourage pt to use mindfulness skills with stressors.       Suicidal/Homicidal: Nowithout intent/plan  Therapist Response: Assessed pt's current functioning and reviewed progress.  Assisted pt  Processing buyin for continued therapy, self esteem.  Assisted pt processing for the management of her stressors.  Plan: Return again in 2 weeks.    Diagnosis: Axis I: Chronic PTSD,    MACKENZIE,LISBETH S, LCAS  10/01/17

## 2017-10-15 ENCOUNTER — Ambulatory Visit (INDEPENDENT_AMBULATORY_CARE_PROVIDER_SITE_OTHER): Payer: Medicaid Other | Admitting: Licensed Clinical Social Worker

## 2017-10-15 ENCOUNTER — Encounter (HOSPITAL_COMMUNITY): Payer: Self-pay | Admitting: Licensed Clinical Social Worker

## 2017-10-15 DIAGNOSIS — F4312 Post-traumatic stress disorder, chronic: Secondary | ICD-10-CM

## 2017-10-15 NOTE — Progress Notes (Signed)
   THERAPIST PROGRESS NOTE  Session Time: 1:10-2pm  Participation Level: Active  Behavioral Response: NeatAlertAnxious   Type of Therapy: Individual Therapy  Treatment Goals addressed: Coping  Interventions: CBT/MI  Summary: Gwendolyn Bowen is a 37 y.o. female.Pt discussed her psychiatric symptoms and current life events. Pt thinks her meds are working well but her external stressors continue. Pt is frustrated with the continued dysfunction of her immediate family: 3 daughters, 1 step daughter and 1 fiance. She does not seem to understand how great the dysfunction is in her immediate family. She and her boyfriend got engaged, which did not seem to make the dysfunction any better. They continue to look for housing hoping a larger space will assist with the behaviors in the house. 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 She will be making more $. Encouraged pt to continue seeking help for her daughter's behaviors, to have strict boundaries, rules and follow through. Pt continues to use mindfulness activities exercises with her stressors.          Suicidal/Homicidal: Nowithout intent/plan  Therapist Response: Assessed pt's current functioning and reviewed progress.  Assisted pt  Processing continued dysfunction in the home, engagement, new job, boundaries.   Assisted pt processing for the management of her stressors.  Plan: Return again in 2 weeks.    Diagnosis: Axis I: Chronic PTSD,    Brodie Correll S, LCAS  10/15/17

## 2017-10-29 ENCOUNTER — Encounter (HOSPITAL_COMMUNITY): Payer: Self-pay | Admitting: Licensed Clinical Social Worker

## 2017-10-29 ENCOUNTER — Ambulatory Visit (INDEPENDENT_AMBULATORY_CARE_PROVIDER_SITE_OTHER): Payer: Medicaid Other | Admitting: Licensed Clinical Social Worker

## 2017-10-29 DIAGNOSIS — F418 Other specified anxiety disorders: Secondary | ICD-10-CM
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
## 2017-11-03 ENCOUNTER — Telehealth: Payer: Self-pay | Admitting: Physician Assistant

## 2017-11-03 NOTE — Telephone Encounter (Signed)
Received FMLA forms and have completed them for intermittent leave She will need an appointment in order for me to complete Fitness For Duty Certification Form

## 2017-11-05 NOTE — Telephone Encounter (Signed)
Forms faxed -EH/RMA  

## 2017-11-06 ENCOUNTER — Ambulatory Visit (INDEPENDENT_AMBULATORY_CARE_PROVIDER_SITE_OTHER): Payer: Medicaid Other | Admitting: Physician Assistant

## 2017-11-06 ENCOUNTER — Encounter: Payer: Self-pay | Admitting: Physician Assistant

## 2017-11-06 VITALS — BP 106/71 | HR 88 | Wt 151.0 lb

## 2017-11-06 DIAGNOSIS — E663 Overweight: Secondary | ICD-10-CM

## 2017-11-06 DIAGNOSIS — G43009 Migraine without aura, not intractable, without status migrainosus: Secondary | ICD-10-CM

## 2017-11-06 DIAGNOSIS — Z713 Dietary counseling and surveillance: Secondary | ICD-10-CM | POA: Diagnosis not present

## 2017-11-06 MED ORDER — BACLOFEN 10 MG PO TABS: 10.0000 mg | ORAL_TABLET | Freq: Three times a day (TID) | ORAL | 5 refills | Status: DC | PRN

## 2017-11-06 MED ORDER — TOPIRAMATE ER 100 MG PO CAP24: 100.0000 mg | ORAL_CAPSULE | Freq: Every day | ORAL | 2 refills | Status: DC
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
## 2017-11-06 NOTE — Patient Instructions (Addendum)
WEIGHT LOSS PLAN - start Trokendi at bedtime - 1335 calorie diet - 30-35% calories from protein - 30% calories from fat - 35-40% from carbs: If diabetic, limit carbs to 30 g per meal and 15 g per snack - MEASURE your calories (MyFitnessPal app) - 8-11 glasses of water per day - limit caffeine to 1 unsweetened beverage per day - avoid fried foods, saturated fat, and heavily processed foods - keep sugar as low as possible  - follow DASH or Mediterranean diets for recipes - avoid skipping meals. Eat 3 regular meals per day with 1-2 snacks (stay within your calorie goal) OR graze every 4 hours. The important thing is to stay on a schedule - avoid eating within 2 hours of bedtime    Exercising to Lose Weight Exercising can help you to lose weight. In order to lose weight through exercise, you need to do vigorous-intensity exercise. You can tell that you are exercising with vigorous intensity if you are breathing very hard and fast and cannot hold a conversation while exercising. Moderate-intensity exercise helps to maintain your current weight. You can tell that you are exercising at a moderate level if you have a higher heart rate and faster breathing, but you are still able to hold a conversation. How often should I exercise? Choose an activity that you enjoy and set realistic goals. Your health care provider can help you to make an activity plan that works for you. Exercise regularly as directed by your health care provider. This may include:  Doing resistance training twice each week, such as: ? Push-ups. ? Sit-ups. ? Lifting weights. ? Using resistance bands.  Doing a given intensity of exercise for a given amount of time. Choose from these options: ? 150 minutes of moderate-intensity exercise every week. ? 75 minutes of vigorous-intensity exercise every week. ? A mix of moderate-intensity and vigorous-intensity exercise every week.  Children, pregnant women, people who are out of  shape, people who are overweight, and older adults may need to consult a health care provider for individual recommendations. If you have any sort of medical condition, be sure to consult your health care provider before starting a new exercise program. What are some activities that can help me to lose weight?  Walking at a rate of at least 4.5 miles an hour.  Jogging or running at a rate of 5 miles per hour.  Biking at a rate of at least 10 miles per hour.  Lap swimming.  Roller-skating or in-line skating.  Cross-country skiing.  Vigorous competitive sports, such as football, basketball, and soccer.  Jumping rope.  Aerobic dancing. How can I be more active in my day-to-day activities?  Use the stairs instead of the elevator.  Take a walk during your lunch break.  If you drive, park your car farther away from work or school.  If you take public transportation, get off one stop early and walk the rest of the way.  Make all of your phone calls while standing up and walking around.  Get up, stretch, and walk around every 30 minutes throughout the day. What guidelines should I follow while exercising?  Do not exercise so much that you hurt yourself, feel dizzy, or get very short of breath.  Consult your health care provider prior to starting a new exercise program.  Wear comfortable clothes and shoes with good support.  Drink plenty of water while you exercise to prevent dehydration or heat stroke. Body water is lost during exercise and  must be replaced.  Work out until you breathe faster and your heart beats faster. This information is not intended to replace advice given to you by your health care provider. Make sure you discuss any questions you have with your health care provider. Document Released: 07/12/2010 Document Revised: 11/15/2015 Document Reviewed: 11/10/2013 Elsevier Interactive Patient Education  [employer].

## 2017-11-12 ENCOUNTER — Ambulatory Visit (HOSPITAL_COMMUNITY): Payer: Self-pay | Admitting: Licensed Clinical Social Worker

## 2017-11-19 ENCOUNTER — Ambulatory Visit (HOSPITAL_COMMUNITY): Payer: Medicaid Other | Admitting: Psychiatry

## 2017-12-01 ENCOUNTER — Ambulatory Visit (INDEPENDENT_AMBULATORY_CARE_PROVIDER_SITE_OTHER): Payer: 59 | Admitting: Licensed Clinical Social Worker

## 2017-12-01 ENCOUNTER — Encounter (HOSPITAL_COMMUNITY): Payer: Self-pay | Admitting: Licensed Clinical Social Worker

## 2017-12-01 DIAGNOSIS — F418 Other specified anxiety disorders: Secondary | ICD-10-CM

## 2017-12-01 DIAGNOSIS — Z62810 Personal history of physical and sexual abuse in childhood: Secondary | ICD-10-CM

## 2017-12-01 DIAGNOSIS — Z635 Disruption of family by separation and divorce: Secondary | ICD-10-CM
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
## 2017-12-02 ENCOUNTER — Ambulatory Visit (INDEPENDENT_AMBULATORY_CARE_PROVIDER_SITE_OTHER): Payer: 59 | Admitting: Psychiatry

## 2017-12-02 ENCOUNTER — Encounter (HOSPITAL_COMMUNITY): Payer: Self-pay | Admitting: Psychiatry

## 2017-12-02 VITALS — BP 120/72 | HR 80 | Ht 63.0 in | Wt 155.0 lb

## 2017-12-02 DIAGNOSIS — F1099 Alcohol use, unspecified with unspecified alcohol-induced disorder: Secondary | ICD-10-CM

## 2017-12-02 DIAGNOSIS — F4312 Post-traumatic stress disorder, chronic: Secondary | ICD-10-CM

## 2017-12-02 DIAGNOSIS — F419 Anxiety disorder, unspecified: Secondary | ICD-10-CM

## 2017-12-02 DIAGNOSIS — R0683 Snoring: Secondary | ICD-10-CM

## 2017-12-02 DIAGNOSIS — G4719 Other hypersomnia: Secondary | ICD-10-CM | POA: Diagnosis not present

## 2017-12-02 DIAGNOSIS — F422 Mixed obsessional thoughts and acts: Secondary | ICD-10-CM | POA: Diagnosis not present

## 2017-12-02 DIAGNOSIS — G4730 Sleep apnea, unspecified: Secondary | ICD-10-CM

## 2017-12-02 MED ORDER — CLOMIPRAMINE HCL 50 MG PO CAPS: 50.0000 mg | ORAL_CAPSULE | Freq: Two times a day (BID) | ORAL | 0 refills | Status: DC
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
## 2017-12-04 ENCOUNTER — Ambulatory Visit (INDEPENDENT_AMBULATORY_CARE_PROVIDER_SITE_OTHER): Payer: Medicaid Other | Admitting: Physician Assistant

## 2017-12-04 ENCOUNTER — Ambulatory Visit: Payer: Self-pay | Admitting: Registered"

## 2017-12-04 VITALS — BP 109/61 | HR 90 | Wt 151.0 lb

## 2017-12-04 DIAGNOSIS — E663 Overweight: Secondary | ICD-10-CM | POA: Diagnosis not present

## 2017-12-04 DIAGNOSIS — R635 Abnormal weight gain: Secondary | ICD-10-CM | POA: Diagnosis not present

## 2017-12-04 NOTE — Progress Notes (Signed)
   Subjective:    Patient ID: Gwendolyn Bowen, female    DOB: June 24, 1980, 37 y.o.   MRN: [id number]  HPI  Sol BlazingSally R Hinckley is here for blood pressure and weight check. Diet and exercise is going well. Denies trouble sleeping or palpitations.   Review of Systems     Objective:   Physical Exam  Vitals:   12/04/17 0858  BP: 109/61  Pulse: 90  SpO2: 100%   Wt Readings from Last 3 Encounters:  12/04/17 151 lb (68.5 kg)  11/06/17 151 lb (68.5 kg)  07/14/17 144 lb (65.3 kg)    Body mass index is 26.75 kg/m.      Assessment & Plan:  Abnormal weight gain - Patient has maintained weight this month. Recommendations will come from provider.

## 2017-12-04 NOTE — Progress Notes (Signed)
Continue Trokendi Patient should schedule follow-up with provider to discuss nutrition and exercise goals She should log everything she eats and bring her food diary to this appointment

## 2017-12-15 ENCOUNTER — Ambulatory Visit (HOSPITAL_COMMUNITY): Payer: Self-pay | Admitting: Licensed Clinical Social Worker

## 2017-12-29 ENCOUNTER — Ambulatory Visit (HOSPITAL_COMMUNITY): Payer: Self-pay | Admitting: Licensed Clinical Social Worker

## 2018-01-05 ENCOUNTER — Ambulatory Visit (INDEPENDENT_AMBULATORY_CARE_PROVIDER_SITE_OTHER): Payer: 59 | Admitting: Licensed Clinical Social Worker

## 2018-01-05 ENCOUNTER — Encounter (HOSPITAL_COMMUNITY): Payer: Self-pay | Admitting: Licensed Clinical Social Worker

## 2018-01-05 DIAGNOSIS — F418 Other specified anxiety disorders: Secondary | ICD-10-CM

## 2018-01-05 NOTE — Progress Notes (Signed)
   THERAPIST PROGRESS NOTE  Session Time: 4:10-5pm  Participation Level: Active  Behavioral Response: NeatAlertAnxious   Type of Therapy: Individual Therapy  Treatment Goals addressed: Coping  Interventions: CBT  Summary: Augustine RadarSally R Eisner is a 37 y.o. female.Pt discussed her psychiatric symptoms and current life events. Pt thinks her meds are working well but her external stressors continue. Pt was agitated because of an argument last night. Role played with pt using I statements and noticing her tone of voice. Pt reports she got a promotion at work with a raise. Pt reports things are more stable at home between all 4 children.  Pt completed PHQ9 with score of 6, moderate depression. GAD7 7, with minimal anxiety. Reviewed screening tools with pt and discussed. External stressors continue: home, work, children, xhusband. Again continue to discuss with pt her external stressors and strategies for external stressors and what she has control over.   Suicidal/Homicidal: Nowithout intent/plan  Therapist Response: Assessed pt's current functioning and reviewed progress.  Assisted pt  Reviewing tx plan, PHQ9, GAD 7, processing external stressors, role played communication skills.  Assisted pt processing for the management of her stressors.  Plan: Return again in 2 weeks.    Diagnosis: Axis I: Depression and Anxiety   MACKENZIE,LISBETH S, LCAS  01/05/18

## 2018-01-12 ENCOUNTER — Ambulatory Visit (HOSPITAL_COMMUNITY): Payer: Self-pay | Admitting: Licensed Clinical Social Worker

## 2018-01-20 ENCOUNTER — Ambulatory Visit (HOSPITAL_BASED_OUTPATIENT_CLINIC_OR_DEPARTMENT_OTHER): Payer: 59 | Attending: Psychiatry | Admitting: Internal Medicine

## 2018-01-20 VITALS — Ht 63.0 in | Wt 155.0 lb

## 2018-01-20 DIAGNOSIS — G4730 Sleep apnea, unspecified: Secondary | ICD-10-CM | POA: Insufficient documentation

## 2018-01-20 DIAGNOSIS — R0683 Snoring: Secondary | ICD-10-CM | POA: Insufficient documentation

## 2018-01-20 DIAGNOSIS — G4719 Other hypersomnia: Secondary | ICD-10-CM | POA: Diagnosis present

## 2018-01-25 ENCOUNTER — Encounter (HOSPITAL_COMMUNITY): Payer: Self-pay | Admitting: Licensed Clinical Social Worker

## 2018-01-25 ENCOUNTER — Ambulatory Visit (INDEPENDENT_AMBULATORY_CARE_PROVIDER_SITE_OTHER): Payer: 59 | Admitting: Licensed Clinical Social Worker

## 2018-01-25 DIAGNOSIS — F418 Other specified anxiety disorders: Secondary | ICD-10-CM

## 2018-01-25 NOTE — Progress Notes (Signed)
   THERAPIST PROGRESS NOTE  Session Time: 4:10-5pm  Participation Level: Active  Behavioral Response: NeatAlertAnxious   Type of Therapy: Individual Therapy  Treatment Goals addressed: Coping  Interventions: CBT  Summary: Gwendolyn Bowen is a 37 y.o. female.Pt discussed her psychiatric symptoms and current life events. Pt thinks her meds are working well but her external stressors continue:children, x husband, pending divorce, boyfriend, job, future. Asked open ended questions about her job. Discussed with pt what part she plays in her job difficulties. Had pt look within herself and identify this. Pt shared she was unaware of how much she brings on herself at work. Discussed with pt where she sees herself for future employment. Pt reports she and her boyfriend continue to look for housing. She is frustrated with the lack of housing within their budget. Encouraged pt to continue in their pursuit of making this a priority. Encouraged pt to to continue working on herself so she can be there for her children. Educated pt on the importance of self care.       Suicidal/Homicidal: Nowithout intent/plan  Therapist Response: Assessed pt's current functioning and reviewed progress.  Assisted pt  Processing job, family, external stressors .  Assisted pt processing for the management of her stressors.  Plan: Return again in 2 weeks.    Diagnosis: Axis I: Depression and Anxiety   MACKENZIE,LISBETH S, LCAS  01/25/18

## 2018-01-27 ENCOUNTER — Encounter (HOSPITAL_COMMUNITY): Payer: Self-pay | Admitting: Psychiatry

## 2018-01-27 ENCOUNTER — Ambulatory Visit (INDEPENDENT_AMBULATORY_CARE_PROVIDER_SITE_OTHER): Payer: 59 | Admitting: Psychiatry

## 2018-01-27 VITALS — BP 122/70 | HR 78 | Ht 63.0 in | Wt 158.0 lb

## 2018-01-27 DIAGNOSIS — F422 Mixed obsessional thoughts and acts: Secondary | ICD-10-CM | POA: Diagnosis not present

## 2018-01-27 DIAGNOSIS — G4730 Sleep apnea, unspecified: Secondary | ICD-10-CM

## 2018-01-27 DIAGNOSIS — R0683 Snoring: Secondary | ICD-10-CM | POA: Diagnosis not present

## 2018-01-27 DIAGNOSIS — F418 Other specified anxiety disorders: Secondary | ICD-10-CM

## 2018-01-27 DIAGNOSIS — F4312 Post-traumatic stress disorder, chronic: Secondary | ICD-10-CM

## 2018-01-27 MED ORDER — MODAFINIL 100 MG PO TABS: 100.0000 mg | ORAL_TABLET | Freq: Every day | ORAL | 1 refills | Status: DC

## 2018-01-27 NOTE — Procedures (Signed)
   Patient Name: Gwendolyn Bowen, Gwendolyn Bowen Study Date: 01/20/2018 Gender: Female D.O.B: Oct 02, 1980 Age (years): 37 Referring Provider: Angus PalmsAlexander Eksir Height (inches): 63 Interpreting Physician: Jetty Duhamellinton Tracy Kinner MD, ABSM Weight (lbs): 155 RPSGT: Celene KrasCharles, Nicole BMI: 27 MRN: [id number] Neck Size: 14.50  CLINICAL INFORMATION Sleep Study Type: HST Indication for sleep study: Excessive Daytime Sleepiness, Snoring  Epworth Sleepiness Score: 6  SLEEP STUDY TECHNIQUE A multi-channel overnight portable sleep study was performed. The channels recorded were: nasal airflow, thoracic respiratory movement, and oxygen saturation with a pulse oximetry. Snoring was also monitored.  MEDICATIONS Patient self administered medications include: none reported.  SLEEP ARCHITECTURE Patient was studied for 438 minutes. The sleep efficiency was 97.8 % and the patient was supine for 41.8%. The arousal index was 0.0 per hour.  RESPIRATORY PARAMETERS The overall AHI was 3.6 per hour, with a central apnea index of 0.0 per hour.  The oxygen nadir was 89% during sleep.  CARDIAC DATA Mean heart rate during sleep was 80.2 bpm.  IMPRESSIONS - No significant obstructive sleep apnea occurred during this study (AHI = 3.6/h). - No significant central sleep apnea occurred during this study (CAI = 0.0/h). - Mild oxygen desaturation was noted during this study (Min O2 = 89%). - Patient snored.  DIAGNOSIS - Normal study  RECOMMENDATIONS - Be careful with alcohol, sedatives and other CNS depressants that may worsen sleep apnea and disrupt normal sleep architecture. - Sleep hygiene should be reviewed to assess factors that may improve sleep quality. - Weight management and regular exercise should be initiated or continued.y  [Electronically signed] 01/27/2018 01:34 PM  Jetty Duhamellinton Erasmus Bistline MD, ABSM Diplomate, American Board of Sleep Medicine   NPI: [id number]                          Jetty Duhamellinton  Erion Hermans Diplomate, American Board of Sleep Medicine  ELECTRONICALLY SIGNED ON:  01/27/2018, 1:33 PM [facility] PH: [phone]   FX: [phone] ACCREDITED BY THE AMERICAN ACADEMY OF SLEEP MEDICINE
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
## 2018-02-10 ENCOUNTER — Telehealth (HOSPITAL_COMMUNITY): Payer: Self-pay

## 2018-02-10 NOTE — Telephone Encounter (Signed)
Prior auth for Modafinil received today, called patients pharmacy and let them know

## 2018-02-11 ENCOUNTER — Encounter: Payer: Self-pay | Admitting: Physician Assistant

## 2018-02-11 ENCOUNTER — Ambulatory Visit (INDEPENDENT_AMBULATORY_CARE_PROVIDER_SITE_OTHER): Payer: 59 | Admitting: Sports Medicine

## 2018-02-11 DIAGNOSIS — S43015A Anterior dislocation of left humerus, initial encounter: Secondary | ICD-10-CM

## 2018-02-11 MED ORDER — HYDROCODONE-ACETAMINOPHEN 5-325 MG PO TABS: 1.0000 | ORAL_TABLET | Freq: Three times a day (TID) | ORAL | 0 refills | Status: DC | PRN

## 2018-02-11 NOTE — Assessment & Plan Note (Addendum)
Reduced in the emergency department. Sling for 2 weeks, we will then start gentle protected range of motion with physical therapy for another 2 weeks, then active range of motion for 4 weeks and then strengthening. She she did have a bit of hyperesthesia over the left deltoid, and now fasciculations suspicious for a axillary neuropraxia. As she does have sensation I do not think the nerve is permanently injured or pinched in the joint. I have encouraged her to do this is a workers comp injury, and we are to keep her on right-handed duty only for the time being. Vicodin for pain in the meantime. Repeat x-rays today.
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
## 2018-02-12 ENCOUNTER — Ambulatory Visit (INDEPENDENT_AMBULATORY_CARE_PROVIDER_SITE_OTHER): Payer: 59

## 2018-02-12 DIAGNOSIS — S43005A Unspecified dislocation of left shoulder joint, initial encounter: Secondary | ICD-10-CM | POA: Diagnosis not present

## 2018-02-12 DIAGNOSIS — S43015A Anterior dislocation of left humerus, initial encounter: Secondary | ICD-10-CM

## 2018-02-12 DIAGNOSIS — W19XXXA Unspecified fall, initial encounter: Secondary | ICD-10-CM | POA: Diagnosis not present

## 2018-02-17 ENCOUNTER — Ambulatory Visit: Payer: 59 | Attending: Sports Medicine | Admitting: Rehabilitative and Restorative Service Providers"

## 2018-02-17 ENCOUNTER — Encounter: Payer: Self-pay | Admitting: Rehabilitative and Restorative Service Providers"

## 2018-02-17 DIAGNOSIS — R293 Abnormal posture: Secondary | ICD-10-CM | POA: Diagnosis present

## 2018-02-17 DIAGNOSIS — R29898 Other symptoms and signs involving the musculoskeletal system: Secondary | ICD-10-CM

## 2018-02-17 DIAGNOSIS — M25512 Pain in left shoulder: Secondary | ICD-10-CM
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
## 2018-02-25 ENCOUNTER — Encounter: Payer: Self-pay | Admitting: Sports Medicine

## 2018-02-25 ENCOUNTER — Ambulatory Visit (INDEPENDENT_AMBULATORY_CARE_PROVIDER_SITE_OTHER): Payer: 59 | Admitting: Sports Medicine

## 2018-02-25 DIAGNOSIS — S43015D Anterior dislocation of left humerus, subsequent encounter: Secondary | ICD-10-CM

## 2018-02-25 DIAGNOSIS — S43015A Anterior dislocation of left humerus, initial encounter: Secondary | ICD-10-CM | POA: Diagnosis not present

## 2018-02-25 MED ORDER — HYDROCODONE-ACETAMINOPHEN 5-325 MG PO TABS: 1.0000 | ORAL_TABLET | Freq: Two times a day (BID) | ORAL | 0 refills | Status: DC | PRN

## 2018-02-25 NOTE — Patient Instructions (Signed)
Discontinue the sling next week and start passive range of motion with physical therapy for 2 weeks then active range of motion for 4 weeks and then strengthening.
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
## 2018-02-25 NOTE — Assessment & Plan Note (Signed)
Now approximately 2 weeks post anterior dislocation of the left shoulder. X-rays do show a prominent Hill-Sachs lesion. She can discontinue the sling next week and start passive range of motion with physical therapy for 2 weeks then active range of motion for 4 weeks and then strengthening. Using about 2 hydrocodone per day, I am refilling this. Return to see me in a month.

## 2018-02-26 ENCOUNTER — Encounter: Payer: Self-pay | Admitting: Sports Medicine

## 2018-02-26 ENCOUNTER — Telehealth: Payer: Self-pay | Admitting: Physician Assistant

## 2018-02-26 ENCOUNTER — Ambulatory Visit: Payer: Self-pay | Admitting: Sports Medicine

## 2018-02-26 NOTE — Telephone Encounter (Signed)
Pt called. She has cancelled her appointment due to having to take her daughter to the ED. She wants to know if she can  have her disability paperwork completed?

## 2018-02-26 NOTE — Telephone Encounter (Signed)
Left pt vm. Thank you

## 2018-02-26 NOTE — Telephone Encounter (Signed)
Of course she can...in an appt.  That was the reason for it.

## 2018-03-02 ENCOUNTER — Encounter: Payer: Self-pay | Admitting: Sports Medicine

## 2018-03-02 ENCOUNTER — Ambulatory Visit (INDEPENDENT_AMBULATORY_CARE_PROVIDER_SITE_OTHER): Payer: 59 | Admitting: Sports Medicine

## 2018-03-02 DIAGNOSIS — S43015D Anterior dislocation of left humerus, subsequent encounter: Secondary | ICD-10-CM | POA: Diagnosis not present
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
## 2018-03-02 NOTE — Assessment & Plan Note (Signed)
Just over 2 weeks post anterior dislocation of the left shoulder. Prominent Hill-Sachs lesion on x-ray. Discontinue sling next week and start passive range of motion with physical therapy for 2 weeks then active range of motion for 4 weeks and then strengthening. She can continue to use hydrocodone as needed. Return in a month. Disability paperwork filled out today.

## 2018-03-10 ENCOUNTER — Ambulatory Visit (HOSPITAL_COMMUNITY): Payer: Self-pay | Admitting: Licensed Clinical Social Worker

## 2018-03-11 ENCOUNTER — Ambulatory Visit: Payer: 59 | Attending: Sports Medicine | Admitting: Physical Therapy

## 2018-03-11 ENCOUNTER — Encounter: Payer: Self-pay | Admitting: Physical Therapy

## 2018-03-11 DIAGNOSIS — R29898 Other symptoms and signs involving the musculoskeletal system: Secondary | ICD-10-CM

## 2018-03-11 DIAGNOSIS — M25512 Pain in left shoulder: Secondary | ICD-10-CM | POA: Diagnosis present

## 2018-03-11 DIAGNOSIS — R293 Abnormal posture: Secondary | ICD-10-CM | POA: Insufficient documentation

## 2018-03-11 NOTE — Patient Instructions (Addendum)
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
## 2018-03-17 ENCOUNTER — Ambulatory Visit: Payer: 59

## 2018-03-17 ENCOUNTER — Encounter: Payer: Self-pay | Admitting: Physician Assistant

## 2018-03-17 ENCOUNTER — Ambulatory Visit (INDEPENDENT_AMBULATORY_CARE_PROVIDER_SITE_OTHER): Payer: 59 | Admitting: Physician Assistant

## 2018-03-17 ENCOUNTER — Ambulatory Visit (HOSPITAL_COMMUNITY): Payer: Self-pay | Admitting: Licensed Clinical Social Worker

## 2018-03-17 VITALS — BP 108/70 | HR 98 | Temp 98.2°F | Wt 165.9 lb

## 2018-03-17 DIAGNOSIS — R29898 Other symptoms and signs involving the musculoskeletal system: Secondary | ICD-10-CM

## 2018-03-17 DIAGNOSIS — M25512 Pain in left shoulder: Secondary | ICD-10-CM | POA: Diagnosis not present

## 2018-03-17 DIAGNOSIS — R635 Abnormal weight gain: Secondary | ICD-10-CM

## 2018-03-17 DIAGNOSIS — Z6829 Body mass index (BMI) 29.0-29.9, adult: Secondary | ICD-10-CM

## 2018-03-17 DIAGNOSIS — Z713 Dietary counseling and surveillance: Secondary | ICD-10-CM

## 2018-03-17 DIAGNOSIS — G43009 Migraine without aura, not intractable, without status migrainosus: Secondary | ICD-10-CM | POA: Diagnosis not present

## 2018-03-17 DIAGNOSIS — Z23 Encounter for immunization: Secondary | ICD-10-CM | POA: Diagnosis not present

## 2018-03-17 DIAGNOSIS — R293 Abnormal posture: Secondary | ICD-10-CM

## 2018-03-17 MED ORDER — TOPIRAMATE ER 100 MG PO CAP24: 100.0000 mg | ORAL_CAPSULE | Freq: Every day | ORAL | 2 refills | Status: DC

## 2018-03-17 MED ORDER — METFORMIN HCL 500 MG PO TABS: 500.0000 mg | ORAL_TABLET | Freq: Every day | ORAL | 2 refills | Status: DC
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
## 2018-03-17 NOTE — Patient Instructions (Addendum)
Talk to Dr. Rene Kocher about permission to start Phentermine. It may interact with your Provigil and Anafranil, so it would need to be closely monitored   Contact Nutrionist at  [phone] Nutrition and Diabetes Education Services at [facility] [address] [place],  [place]  1200 calories /day Use MyFitnessPal app to log daily intake of food, drink and exercise.  Make snacks high in protein (>10g) and low in carbs (<15g). Stay away from high sugar drinks and foods.  Consider getting a Education officer, museum or Garmin to track daily steps.  Aim for 10,000 steps per day.  Stay active! Try to work out 3-4 days per week for 30-45 minutes. Aim for 64 oz of water each day. Work on stress reduction, meal planning and 8 hours of sleep at night. Don't skip meals. Can substitute a protein drink for one meal per day.   Calorie Counting for Weight Loss Calories are units of energy. Your body needs a certain amount of calories from food to keep you going throughout the day. When you eat more calories than your body needs, your body stores the extra calories as fat. When you eat fewer calories than your body needs, your body burns fat to get the energy it needs. Calorie counting means keeping track of how many calories you eat and drink each day. Calorie counting can be helpful if you need to lose weight. If you make sure to eat fewer calories than your body needs, you should lose weight. Ask your health care provider what a healthy weight is for you. For calorie counting to work, you will need to eat the right number of calories in a day in order to lose a healthy amount of weight per week. A dietitian can help you determine how many calories you need in a day and will give you suggestions on how to reach your calorie goal.  A healthy amount of weight to lose per week is usually 1-2 lb (0.5-0.9 kg). This usually means that your daily calorie intake should be reduced by 500-750 calories.  Eating 1,200  - 1,500 calories per day can help most women lose weight.  Eating 1,500 - 1,800 calories per day can help most men lose weight.   What do I need to know about calorie counting? In order to meet your daily calorie goal, you will need to:  Find out how many calories are in each food you would like to eat. Try to do this before you eat.  Decide how much of the food you plan to eat.  Write down what you ate and how many calories it had. Doing this is called keeping a food log.  To successfully lose weight, it is important to balance calorie counting with a healthy lifestyle that includes regular activity. Aim for 150 minutes of moderate exercise (such as walking) or 75 minutes of vigorous exercise (such as running) each week. Where do I find calorie information?  The number of calories in a food can be found on a Nutrition Facts label. If a food does not have a Nutrition Facts label, try to look up the calories online or ask your dietitian for help. Remember that calories are listed per serving. If you choose to have more than one serving of a food, you will have to multiply the calories per serving by the amount of servings you plan to eat. For example, the label on a package of bread might say that a serving size is 1  slice and that there are 90 calories in a serving. If you eat 1 slice, you will have eaten 90 calories. If you eat 2 slices, you will have eaten 180 calories. How do I keep a food log? Immediately after each meal, record the following information in your food log:  What you ate. Don't forget to include toppings, sauces, and other extras on the food.  How much you ate. This can be measured in cups, ounces, or number of items.  How many calories each food and drink had.  The total number of calories in the meal.  Keep your food log near you, such as in a small notebook in your pocket, or use a mobile app or website. Some programs will calculate calories for you and show you  how many calories you have left for the day to meet your goal. What are some calorie counting tips?  Use your calories on foods and drinks that will fill you up and not leave you hungry: ? Some examples of foods that fill you up are nuts and nut butters, vegetables, lean proteins, and high-fiber foods like whole grains. High-fiber foods are foods with more than 5 g fiber per serving. ? Drinks such as sodas, specialty coffee drinks, alcohol, and juices have a lot of calories, yet do not fill you up.  Eat nutritious foods and avoid empty calories. Empty calories are calories you get from foods or beverages that do not have many vitamins or protein, such as candy, sweets, and soda. It is better to have a nutritious high-calorie food (such as an avocado) than a food with few nutrients (such as a bag of chips).  Know how many calories are in the foods you eat most often. This will help you calculate calorie counts faster.  Pay attention to calories in drinks. Low-calorie drinks include water and unsweetened drinks.  Pay attention to nutrition labels for "low fat" or "fat free" foods. These foods sometimes have the same amount of calories or more calories than the full fat versions. They also often have added sugar, starch, or salt, to make up for flavor that was removed with the fat.  Find a way of tracking calories that works for you. Get creative. Try different apps or programs if writing down calories does not work for you. What are some portion control tips?  Know how many calories are in a serving. This will help you know how many servings of a certain food you can have.  Use a measuring cup to measure serving sizes. You could also try weighing out portions on a kitchen scale. With time, you will be able to estimate serving sizes for some foods.  Take some time to put servings of different foods on your favorite plates, bowls, and cups so you know what a serving looks like.  Try not to eat  straight from a bag or box. Doing this can lead to overeating. Put the amount you would like to eat in a cup or on a plate to make sure you are eating the right portion.  Use smaller plates, glasses, and bowls to prevent overeating.  Try not to multitask (for example, watch TV or use your computer) while eating. If it is time to eat, sit down at a table and enjoy your food. This will help you to know when you are full. It will also help you to be aware of what you are eating and how much you are eating. What are tips  for following this plan? Reading food labels  Check the calorie count compared to the serving size. The serving size may be smaller than what you are used to eating.  Check the source of the calories. Make sure the food you are eating is high in vitamins and protein and low in saturated and trans fats. Shopping  Read nutrition labels while you shop. This will help you make healthy decisions before you decide to purchase your food.  Make a grocery list and stick to it. Cooking  Try to cook your favorite foods in a healthier way. For example, try baking instead of frying.  Use low-fat dairy products. Meal planning  Use more fruits and vegetables. Half of your plate should be fruits and vegetables.  Include lean proteins like poultry and fish. How do I count calories when eating out?  Ask for smaller portion sizes.  Consider sharing an entree and sides instead of getting your own entree.  If you get your own entree, eat only half. Ask for a box at the beginning of your meal and put the rest of your entree in it so you are not tempted to eat it.  If calories are listed on the menu, choose the lower calorie options.  Choose dishes that include vegetables, fruits, whole grains, low-fat dairy products, and lean protein.  Choose items that are boiled, broiled, grilled, or steamed. Stay away from items that are buttered, battered, fried, or served with cream sauce. Items  labeled "crispy" are usually fried, unless stated otherwise.  Choose water, low-fat milk, unsweetened iced tea, or other drinks without added sugar. If you want an alcoholic beverage, choose a lower calorie option such as a glass of wine or light beer.  Ask for dressings, sauces, and syrups on the side. These are usually high in calories, so you should limit the amount you eat.  If you want a salad, choose a garden salad and ask for grilled meats. Avoid extra toppings like bacon, cheese, or fried items. Ask for the dressing on the side, or ask for olive oil and vinegar or lemon to use as dressing.  Estimate how many servings of a food you are given. For example, a serving of cooked rice is  cup or about the size of half a baseball. Knowing serving sizes will help you be aware of how much food you are eating at restaurants. The list below tells you how big or small some common portion sizes are based on everyday objects: ? 1 oz-4 stacked dice. ? 3 oz-1 deck of cards. ? 1 tsp-1 die. ? 1 Tbsp- a ping-pong ball. ? 2 Tbsp-1 ping-pong ball. ?  cup- baseball. ? 1 cup-1 baseball. Summary  Calorie counting means keeping track of how many calories you eat and drink each day. If you eat fewer calories than your body needs, you should lose weight.  A healthy amount of weight to lose per week is usually 1-2 lb (0.5-0.9 kg). This usually means reducing your daily calorie intake by 500-750 calories.  The number of calories in a food can be found on a Nutrition Facts label. If a food does not have a Nutrition Facts label, try to look up the calories online or ask your dietitian for help.  Use your calories on foods and drinks that will fill you up, and not on foods and drinks that will leave you hungry.  Use smaller plates, glasses, and bowls to prevent overeating. This information is not intended to replace  advice given to you by your health care provider. Make sure you discuss any questions you have  with your health care provider. Document Released: 06/09/2005 Document Revised: 05/09/2016 Document Reviewed: 05/09/2016 Elsevier Interactive Patient Education  [employer].
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
## 2018-03-22 ENCOUNTER — Ambulatory Visit: Payer: 59

## 2018-03-22 DIAGNOSIS — M25512 Pain in left shoulder: Secondary | ICD-10-CM

## 2018-03-22 DIAGNOSIS — R293 Abnormal posture: Secondary | ICD-10-CM

## 2018-03-22 DIAGNOSIS — R29898 Other symptoms and signs involving the musculoskeletal system: Secondary | ICD-10-CM
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
## 2018-03-23 ENCOUNTER — Ambulatory Visit (HOSPITAL_COMMUNITY): Payer: Self-pay | Admitting: Licensed Clinical Social Worker

## 2018-03-24 ENCOUNTER — Ambulatory Visit: Payer: 59 | Attending: Sports Medicine

## 2018-03-24 DIAGNOSIS — M25512 Pain in left shoulder: Secondary | ICD-10-CM | POA: Insufficient documentation

## 2018-03-24 DIAGNOSIS — R29898 Other symptoms and signs involving the musculoskeletal system: Secondary | ICD-10-CM | POA: Insufficient documentation

## 2018-03-24 DIAGNOSIS — R293 Abnormal posture: Secondary | ICD-10-CM | POA: Insufficient documentation

## 2018-03-25 ENCOUNTER — Ambulatory Visit (INDEPENDENT_AMBULATORY_CARE_PROVIDER_SITE_OTHER): Payer: 59 | Admitting: Sports Medicine

## 2018-03-25 ENCOUNTER — Encounter: Payer: Self-pay | Admitting: Sports Medicine

## 2018-03-25 DIAGNOSIS — S43015D Anterior dislocation of left humerus, subsequent encounter: Secondary | ICD-10-CM | POA: Diagnosis not present

## 2018-03-25 MED ORDER — CELECOXIB 200 MG PO CAPS: ORAL_CAPSULE | ORAL | 2 refills | Status: DC
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
## 2018-03-25 NOTE — Assessment & Plan Note (Signed)
Now about 5 weeks post anterior dislocation of the left shoulder with a prominent Hill-Sachs lesion. She will continue with physical therapy, she has approximately 12 more visits. She has discontinued the sling, adding Celebrex (there is very little sulfa cross-reactivity), continue with hydrocodone as needed for breakthrough pain, she has half a bottle left of this. I am going to extend her disability, it extends on November 1. Return to see me in 6 weeks. She does understand that this is a long-term process, and she is improving slowly.

## 2018-03-29 ENCOUNTER — Encounter: Payer: Self-pay | Admitting: Physical Therapy

## 2018-03-29 ENCOUNTER — Ambulatory Visit: Payer: 59 | Admitting: Physical Therapy

## 2018-03-29 DIAGNOSIS — R293 Abnormal posture: Secondary | ICD-10-CM | POA: Diagnosis present

## 2018-03-29 DIAGNOSIS — M25512 Pain in left shoulder: Secondary | ICD-10-CM

## 2018-03-29 DIAGNOSIS — R29898 Other symptoms and signs involving the musculoskeletal system: Secondary | ICD-10-CM | POA: Diagnosis present
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
## 2018-03-31 ENCOUNTER — Ambulatory Visit (INDEPENDENT_AMBULATORY_CARE_PROVIDER_SITE_OTHER): Payer: 59 | Admitting: Licensed Clinical Social Worker

## 2018-03-31 ENCOUNTER — Encounter (HOSPITAL_COMMUNITY): Payer: Self-pay | Admitting: Licensed Clinical Social Worker

## 2018-03-31 DIAGNOSIS — F418 Other specified anxiety disorders: Secondary | ICD-10-CM | POA: Diagnosis not present

## 2018-03-31 NOTE — Progress Notes (Signed)
   THERAPIST PROGRESS NOTE  Session Time: 10:00-10:50am  Participation Level: Active  Behavioral Response: NeatAlertAnxious   Type of Therapy: Individual Therapy  Treatment Goals addressed: Coping  Interventions: CBT  Summary: Gwendolyn Bowen is a 37 y.o. female.Pt discussed her psychiatric symptoms and current life events.Pt has not been to therapy in 2 months. She had a fall at work and dislocated her shoulder, out of work on STD, moved, in PT. Pt had a fall 30 days ago at work. Pt and her boyfriend and their 4 children moved into an apt 1395$  Per month. Educated pt on budgeting, identifying necessities, frivolous spending. Pt reported she is having plastic surgery at the end of the month. Discussed this at length with pt. Asked open ended questions. Pt feels this surgery will improve her self esteem. Educated pt on self esteem. Pt saw psychiatrist dr Rene Kocher and he kept her on her meds and added a new one (she can't remember the name of the medication). Discussed with pt the importance of her self care.    Suicidal/Homicidal: Nowithout intent/plan  Therapist Response: Assessed pt's current functioning and reviewed progress.  Assisted pt  Processing job, family, budgeting, self esteem .  Assisted pt processing for the management of her stressors.  Plan: Return again in 2 weeks. Discuss Hospital doctor (old friend). Sister and mother relationships   Diagnosis: Axis I: Depression and Anxiety   MACKENZIE,LISBETH S, LCAS  03/31/18

## 2018-04-01 ENCOUNTER — Encounter: Payer: Self-pay | Admitting: Physical Therapy

## 2018-04-01 ENCOUNTER — Ambulatory Visit: Payer: 59 | Admitting: Physical Therapy

## 2018-04-01 DIAGNOSIS — R29898 Other symptoms and signs involving the musculoskeletal system: Secondary | ICD-10-CM

## 2018-04-01 DIAGNOSIS — M25512 Pain in left shoulder: Secondary | ICD-10-CM

## 2018-04-01 DIAGNOSIS — R293 Abnormal posture: Secondary | ICD-10-CM
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
## 2018-04-05 ENCOUNTER — Ambulatory Visit: Payer: 59 | Admitting: Physical Therapy

## 2018-04-05 ENCOUNTER — Encounter: Payer: Self-pay | Admitting: Physical Therapy

## 2018-04-05 DIAGNOSIS — R293 Abnormal posture: Secondary | ICD-10-CM

## 2018-04-05 DIAGNOSIS — R29898 Other symptoms and signs involving the musculoskeletal system: Secondary | ICD-10-CM

## 2018-04-05 DIAGNOSIS — M25512 Pain in left shoulder: Secondary | ICD-10-CM | POA: Diagnosis not present
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
## 2018-04-06 ENCOUNTER — Ambulatory Visit (HOSPITAL_COMMUNITY): Payer: Self-pay | Admitting: Licensed Clinical Social Worker

## 2018-04-08 ENCOUNTER — Ambulatory Visit: Payer: 59

## 2018-04-08 DIAGNOSIS — R293 Abnormal posture: Secondary | ICD-10-CM

## 2018-04-08 DIAGNOSIS — R29898 Other symptoms and signs involving the musculoskeletal system: Secondary | ICD-10-CM

## 2018-04-08 DIAGNOSIS — M25512 Pain in left shoulder: Secondary | ICD-10-CM
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
## 2018-04-12 ENCOUNTER — Ambulatory Visit: Payer: 59 | Admitting: Physical Therapy

## 2018-04-12 ENCOUNTER — Encounter: Payer: Self-pay | Admitting: Physical Therapy

## 2018-04-12 DIAGNOSIS — R29898 Other symptoms and signs involving the musculoskeletal system: Secondary | ICD-10-CM

## 2018-04-12 DIAGNOSIS — M25512 Pain in left shoulder: Secondary | ICD-10-CM

## 2018-04-12 DIAGNOSIS — R293 Abnormal posture: Secondary | ICD-10-CM
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
## 2018-04-15 ENCOUNTER — Ambulatory Visit: Payer: 59

## 2018-04-15 DIAGNOSIS — M25512 Pain in left shoulder: Secondary | ICD-10-CM | POA: Diagnosis not present

## 2018-04-15 DIAGNOSIS — R293 Abnormal posture: Secondary | ICD-10-CM

## 2018-04-15 DIAGNOSIS — R29898 Other symptoms and signs involving the musculoskeletal system: Secondary | ICD-10-CM
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
## 2018-04-19 ENCOUNTER — Telehealth: Payer: Self-pay | Admitting: Physician Assistant

## 2018-04-19 ENCOUNTER — Ambulatory Visit: Payer: 59

## 2018-04-19 DIAGNOSIS — M25512 Pain in left shoulder: Secondary | ICD-10-CM

## 2018-04-19 DIAGNOSIS — R293 Abnormal posture: Secondary | ICD-10-CM

## 2018-04-19 DIAGNOSIS — R29898 Other symptoms and signs involving the musculoskeletal system: Secondary | ICD-10-CM
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
## 2018-04-19 NOTE — Telephone Encounter (Signed)
Danielle from [employer] calling in wanting to know if patient was cleared to start work on 04/26/2018 and the last appointment. Please contact and advise.

## 2018-04-19 NOTE — Telephone Encounter (Signed)
Pt is not cleared for work until Dec 1st.

## 2018-04-20 ENCOUNTER — Encounter (HOSPITAL_COMMUNITY): Payer: Self-pay | Admitting: Licensed Clinical Social Worker

## 2018-04-20 ENCOUNTER — Ambulatory Visit (INDEPENDENT_AMBULATORY_CARE_PROVIDER_SITE_OTHER): Payer: 59 | Admitting: Licensed Clinical Social Worker

## 2018-04-20 DIAGNOSIS — F418 Other specified anxiety disorders: Secondary | ICD-10-CM | POA: Diagnosis not present

## 2018-04-20 NOTE — Progress Notes (Signed)
   THERAPIST PROGRESS NOTE  Session Time: 3:10-4pm  Participation Level: Active  Behavioral Response: NeatAlertAnxious   Type of Therapy: Individual Therapy  Treatment Goals addressed: Coping  Interventions: CBT  Summary: Gwendolyn Bowen is a 37 y.o. female.Pt discussed her psychiatric symptoms and current life events. Pt feels her moods are more stable. She is having liposuction and tummy tuck tomorrow. Discussed this decision at length with her. Last session discussed budgeting, necessities vs frivolous spending. Pt shared the new apt has allowed her family to get along much better with less arguing and fights between the 4 kids. She is still on disability and goes to PT 2x per week. 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 Pt shared she and her old best friend have begun talking again trying to reestablish their relationship. Role played with pt communication styles to use.      Suicidal/Homicidal: Nowithout intent/plan  Therapist Response: Assessed pt's current functioning and reviewed progress.  Assisted pt  Processing family, budgeting, communication styles .  Assisted pt processing for the management of her stressors.  Plan: Return again in 2 weeks. Discuss Hospital doctor (old friend). Sister and mother relationships   Diagnosis: Axis I: Depression and Anxiety   MACKENZIE,LISBETH S, LCAS  04/20/18

## 2018-04-22 ENCOUNTER — Ambulatory Visit: Payer: 59 | Admitting: Physical Therapy

## 2018-04-26 ENCOUNTER — Ambulatory Visit: Payer: 59

## 2018-04-29 ENCOUNTER — Ambulatory Visit: Payer: 59 | Admitting: Physical Therapy

## 2018-05-03 ENCOUNTER — Encounter: Payer: Self-pay | Admitting: Physical Therapy

## 2018-05-03 ENCOUNTER — Ambulatory Visit: Payer: 59 | Attending: Sports Medicine | Admitting: Physical Therapy

## 2018-05-03 DIAGNOSIS — M25512 Pain in left shoulder: Secondary | ICD-10-CM | POA: Diagnosis not present

## 2018-05-03 DIAGNOSIS — R29898 Other symptoms and signs involving the musculoskeletal system: Secondary | ICD-10-CM | POA: Insufficient documentation

## 2018-05-03 DIAGNOSIS — R293 Abnormal posture: Secondary | ICD-10-CM | POA: Insufficient documentation
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
## 2018-05-04 ENCOUNTER — Ambulatory Visit (INDEPENDENT_AMBULATORY_CARE_PROVIDER_SITE_OTHER): Payer: 59 | Admitting: Licensed Clinical Social Worker

## 2018-05-04 DIAGNOSIS — F418 Other specified anxiety disorders: Secondary | ICD-10-CM

## 2018-05-05 ENCOUNTER — Ambulatory Visit: Payer: 59 | Admitting: Physical Therapy

## 2018-05-05 ENCOUNTER — Encounter: Payer: Self-pay | Admitting: Physical Therapy

## 2018-05-05 ENCOUNTER — Encounter (HOSPITAL_COMMUNITY): Payer: Self-pay | Admitting: Licensed Clinical Social Worker

## 2018-05-05 DIAGNOSIS — R29898 Other symptoms and signs involving the musculoskeletal system: Secondary | ICD-10-CM

## 2018-05-05 DIAGNOSIS — R293 Abnormal posture: Secondary | ICD-10-CM

## 2018-05-05 DIAGNOSIS — M25512 Pain in left shoulder: Secondary | ICD-10-CM | POA: Diagnosis not present

## 2018-05-05 NOTE — Progress Notes (Signed)
   THERAPIST PROGRESS NOTE  Session Time: 10:10-11am  Participation Level: Active  Behavioral Response: NeatAlert/Anxious   Type of Therapy: Individual Therapy  Treatment Goals addressed: Improve Psychiatric Symptoms, elevate mood (increased self-esteem, increased self-compassion, increased interaction), improve unhelpful thought patterns, controlled behavior, moderate mood, deliberate speech and thought process(improved social functioning, healthy adjustment to living situation), Learn about diagnosis, healthy coping skills.  Interventions: CBT  Summary: Gwendolyn Bowen is a 37 y.o. female.Pt discussed her psychiatric symptoms and current life events. Pt feels her moods are more stable. Pt had liposuction and tummy tuck and presents in pain. Discussed with pt her continued need for "getting what she wants no matter the consequences." She and her fiance are having discussions about another man paying for her surgery. She and her mother have a tenuous relationship. Discussed with pt about fulfilling the needs of her children. Discussed with pt her desire for getting her needs met. Pt has lost several relationships. Asked open ended questions.   Suicidal/Homicidal: Nowithout intent/plan  Therapist Response: Assessed pt's current functioning and reviewed progress.  Assisted pt  processing relationships and self centeredness. Assisted pt processing for the management of her stressors.  Plan: Return again in 2 weeks. Discuss Museum/gallery conservator (old friend). Sister and mother relationships   Diagnosis: Axis I: Depression and Anxiety   MACKENZIE,LISBETH S, LCAS  05/04/18
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
## 2018-05-05 NOTE — Patient Instructions (Signed)
Flexibility: Corner Stretch    Standing in corner with hands just above shoulder level, step one foot forward as you  lean forward until a comfortable stretch is felt across chest. Hold _20__ seconds. Repeat __3__ times per set. Do _2_ sessions per day.  Elbow Press    Interlace fingers; bring hands underneath head. Place pillow under elbow to relax into stretch.  Once this is easy, press elbows down. Hold _10__ seconds. Relax arms. Repeat _3-5__ times.  [place]: Double Arm    Arms near sides, palms up. Press both arms lightly into floor, slide arms out to side and up alongside head. Keep contact with floor throughout motion. At maximal position, lengthen arms. Hold __5-_ seconds. Relax. Slide arms back to start. Repeat __10_ times.  * cross chest stretch  * Sink stretch   * belt stretch with hand behind back.

## 2018-05-06 NOTE — Addendum Note (Signed)
Addended by: Vernona RiegerMACKENZIE, Mikal Wisman S on: 05/06/2018 11:16 AM   Modules accepted: Level of Service

## 2018-05-10 ENCOUNTER — Ambulatory Visit (INDEPENDENT_AMBULATORY_CARE_PROVIDER_SITE_OTHER): Payer: 59 | Admitting: Sports Medicine

## 2018-05-10 ENCOUNTER — Encounter: Payer: Self-pay | Admitting: Sports Medicine

## 2018-05-10 ENCOUNTER — Ambulatory Visit (INDEPENDENT_AMBULATORY_CARE_PROVIDER_SITE_OTHER): Payer: 59

## 2018-05-10 ENCOUNTER — Encounter (HOSPITAL_COMMUNITY): Payer: Self-pay | Admitting: Licensed Clinical Social Worker

## 2018-05-10 ENCOUNTER — Ambulatory Visit (INDEPENDENT_AMBULATORY_CARE_PROVIDER_SITE_OTHER): Payer: 59 | Admitting: Licensed Clinical Social Worker

## 2018-05-10 VITALS — BP 101/70 | HR 101

## 2018-05-10 DIAGNOSIS — M7552 Bursitis of left shoulder: Secondary | ICD-10-CM

## 2018-05-10 DIAGNOSIS — S43015D Anterior dislocation of left humerus, subsequent encounter: Secondary | ICD-10-CM

## 2018-05-10 DIAGNOSIS — F418 Other specified anxiety disorders: Secondary | ICD-10-CM | POA: Diagnosis not present

## 2018-05-10 DIAGNOSIS — W19XXXD Unspecified fall, subsequent encounter: Secondary | ICD-10-CM

## 2018-05-10 DIAGNOSIS — S43006A Unspecified dislocation of unspecified shoulder joint, initial encounter: Secondary | ICD-10-CM | POA: Diagnosis not present

## 2018-05-10 DIAGNOSIS — Z23 Encounter for immunization: Secondary | ICD-10-CM | POA: Diagnosis not present

## 2018-05-10 NOTE — Assessment & Plan Note (Signed)
Anterior dislocation 11 weeks ago with prominent Hill-Sachs lesion. At this point she has been through almost 3 months of formal physical therapy, still with significant discomfort. Proceeding with MR arthrogram. Arthrograms can be diagnostic and therapeutic, continue physical therapy for now. She is on short-term disability.
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
## 2018-05-10 NOTE — Progress Notes (Signed)
   Procedure: Real-time Ultrasound Guided gadolinium contrast injection of left glenohumeral joint Device: GE Logiq E  Verbal informed consent obtained.  Time-out conducted.  Noted no overlying erythema, induration, or other signs of local infection.  Skin prepped in a sterile fashion.  Local anesthesia: Topical Ethyl chloride.  With sterile technique and under real time ultrasound guidance: 22-gauge spinal needle advanced into the glenohumeral joint, taking care to avoid the labrum I injected 1 cc kenalog 40, 2 cc lidocaine, 2 cc bupivacaine, syringe switched and 0.1 cc gadolinium injected, syringe again switched and 5 cc Isovue injected, syringe switched and 10 cc sterile saline injected to distend the joint fully. Joint visualized and capsule seen distending confirming intra-articular placement of contrast material and medication. Completed without difficulty  Advised to call if fevers/chills, erythema, induration, drainage, or persistent bleeding.  Images permanently stored and available for review in the ultrasound unit.  Impression: Technically successful ultrasound guided gadolinium contrast injection for MR arthrography.  Please see separate MR arthrogram report.

## 2018-05-10 NOTE — Progress Notes (Signed)
   THERAPIST PROGRESS NOTE  Session Time: 2:10-3pm  Participation Level: Active  Behavioral Response: NeatAlert/Anxious   Type of Therapy: Individual Therapy/Tx plan update  Treatment Goals addressed: Improve Psychiatric Symptoms, elevate mood (increased self-esteem, increased self-compassion, increased interaction), improve unhelpful thought patterns, controlled behavior, moderate mood, deliberate speech and thought process(improved social functioning, healthy adjustment to living situation), Learn about diagnosis, healthy coping skills.  Interventions: CBT  Summary: Augustine RadarSally R Lindenberger is a 37 y.o. female.Pt discussed her psychiatric symptoms and current life events. Pt reports she has been depressed & tearful since her last session. Asked open ended questions. Pt has begun to look at her relationships and how she affect those around her. Discussed communication styles with pt and whether they are effective. Discussed with pt the art of compromise and role played with pt how to compromise. Educated pt on letting go. Reviewed tx plan with pt.    Suicidal/Homicidal: Nowithout intent/plan  Therapist Response: Assessed pt's current functioning and reviewed progress.  Assisted pt  processing relationships, effective communication skills, compromise, tx plan review.and self centeredness. Assisted pt processing for the management of her stressors.  Plan: Return again in 2 weeks.  Diagnosis: Axis I: Depression and Anxiety   Nastacia Raybuck S, LCAS  05/11/18

## 2018-05-12 ENCOUNTER — Ambulatory Visit: Payer: 59 | Admitting: Physical Therapy

## 2018-05-14 ENCOUNTER — Ambulatory Visit: Payer: 59 | Admitting: Physical Therapy

## 2018-05-14 ENCOUNTER — Encounter: Payer: Self-pay | Admitting: Physical Therapy

## 2018-05-14 DIAGNOSIS — M25512 Pain in left shoulder: Secondary | ICD-10-CM

## 2018-05-14 DIAGNOSIS — R29898 Other symptoms and signs involving the musculoskeletal system: Secondary | ICD-10-CM

## 2018-05-14 DIAGNOSIS — R293 Abnormal posture: Secondary | ICD-10-CM
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
## 2018-05-18 ENCOUNTER — Telehealth: Payer: Self-pay | Admitting: *Deleted

## 2018-05-18 NOTE — Telephone Encounter (Signed)
Pt left a vm wanting to know what you recommend for her returning to work.  In the last note, you have her out until Dec 1. Last MRI showed [place] lesion and you wanted her to do another month of PT after the arthrogram.  If you want her to stay out of work past Dec 1, she will need to file another extension on her short term disability.  Please advise.

## 2018-05-18 NOTE — Telephone Encounter (Signed)
I am going to write another note keeping her out until the end of January. Letter in box.

## 2018-05-18 NOTE — Telephone Encounter (Signed)
LMOM notifying pt of letter. °

## 2018-05-24 ENCOUNTER — Ambulatory Visit (INDEPENDENT_AMBULATORY_CARE_PROVIDER_SITE_OTHER): Payer: 59 | Admitting: Licensed Clinical Social Worker

## 2018-05-24 ENCOUNTER — Encounter: Payer: Self-pay | Admitting: Physical Therapy

## 2018-05-24 ENCOUNTER — Ambulatory Visit: Payer: 59 | Attending: Sports Medicine | Admitting: Physical Therapy

## 2018-05-24 ENCOUNTER — Encounter (HOSPITAL_COMMUNITY): Payer: Self-pay | Admitting: Licensed Clinical Social Worker

## 2018-05-24 DIAGNOSIS — R293 Abnormal posture: Secondary | ICD-10-CM

## 2018-05-24 DIAGNOSIS — R29898 Other symptoms and signs involving the musculoskeletal system: Secondary | ICD-10-CM | POA: Insufficient documentation

## 2018-05-24 DIAGNOSIS — F418 Other specified anxiety disorders: Secondary | ICD-10-CM | POA: Diagnosis not present

## 2018-05-24 DIAGNOSIS — M25512 Pain in left shoulder: Secondary | ICD-10-CM

## 2018-05-24 NOTE — Progress Notes (Signed)
   THERAPIST PROGRESS NOTE  Session Time: 2:10-3pm  Participation Level: Active  Behavioral Response: NeatAlert/Anxious   Type of Therapy: Individual Therapy  Treatment Goals addressed: Improve Psychiatric Symptoms, elevate mood (increased self-esteem, increased self-compassion, increased interaction), improve unhelpful thought patterns, controlled behavior, moderate mood, deliberate speech and thought process(improved social functioning, healthy adjustment to living situation), Learn about diagnosis, healthy coping skills.  Interventions: CBT  Summary: Gwendolyn RadarSally R Fitzmaurice is a 37 y.o. female.Pt discussed her psychiatric symptoms and current life events. Pt present stressed today. She is worried. Educated pt on how her fear of the future manifests into anxiety. Pt is concerned about bills & $. Reviewed budget with pt trying to ally some of her fears. Discussed with pt Christmas presents she has "spoiled" for her children and the importance of what values to instill in her children. Pt and her mother still have a tenuous relationship, she did not see her mother for the holidays. Discussed with pt "trying to make her mother be the mother she needs vs what her mother is willing to do." Asked open ended questions. Discussed with pt, "check the facts." and role played with pt how to use "check the facts." Suggested to pt to continue to use her mindfulness skills during stressful times.   Suicidal/Homicidal: Nowithout intent/plan  Therapist Response: Assessed pt's current functioning and reviewed progress.  Assisted pt  processing relationships, budgeting, failures, relationship with mother,effective communication skills, "check the facts". Assisted pt processing for the management of her stressors.  Plan: Return again in 2 weeks.  Diagnosis: Axis I: Depression and Anxiety   MACKENZIE,LISBETH S, LCAS  05/24/18
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
## 2018-05-25 ENCOUNTER — Ambulatory Visit (INDEPENDENT_AMBULATORY_CARE_PROVIDER_SITE_OTHER): Payer: 59 | Admitting: Sports Medicine

## 2018-05-25 ENCOUNTER — Encounter: Payer: Self-pay | Admitting: Sports Medicine

## 2018-05-25 DIAGNOSIS — S43015D Anterior dislocation of left humerus, subsequent encounter: Secondary | ICD-10-CM

## 2018-05-25 NOTE — Assessment & Plan Note (Signed)
Anterior shoulder dislocation approximately 14 weeks ago, prominent Hill-Sachs lesion, she has done significantly better after the MR arthrogram, working with physical therapy, pain is better, range of motion continues to improve. I filled out a huge amount of short-term disability paperwork today, with an unrestricted return to work date of July 23, 2018. I like to see her back in a month, continue physical therapy for now.
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
## 2018-05-26 ENCOUNTER — Encounter: Payer: Self-pay | Admitting: Physical Therapy

## 2018-05-31 ENCOUNTER — Ambulatory Visit (HOSPITAL_COMMUNITY): Payer: 59 | Admitting: Licensed Clinical Social Worker

## 2018-06-07 ENCOUNTER — Encounter (HOSPITAL_COMMUNITY): Payer: Self-pay | Admitting: Physician Assistant

## 2018-06-07 ENCOUNTER — Ambulatory Visit (INDEPENDENT_AMBULATORY_CARE_PROVIDER_SITE_OTHER): Payer: 59 | Admitting: Licensed Clinical Social Worker

## 2018-06-07 DIAGNOSIS — F418 Other specified anxiety disorders: Secondary | ICD-10-CM | POA: Diagnosis not present

## 2018-06-08 ENCOUNTER — Encounter (HOSPITAL_COMMUNITY): Payer: Self-pay | Admitting: Licensed Clinical Social Worker

## 2018-06-08 NOTE — Progress Notes (Signed)
   THERAPIST PROGRESS NOTE  Session Time: 3:10-4pm  Participation Level: Active  Behavioral Response: NeatAlert/Depressed   Type of Therapy: Individual Therapy  Treatment Goals addressed: Improve Psychiatric Symptoms, elevate mood (increased self-esteem, increased self-compassion, increased interaction), improve unhelpful thought patterns, controlled behavior, moderate mood, deliberate speech and thought process(improved social functioning, healthy adjustment to living situation), Learn about diagnosis, healthy coping skills.  Interventions: CBT  Summary: Gwendolyn Bowen is a 37 y.o. female.Pt discussed her psychiatric symptoms and current life events. Pt present depressed today. She continues to worry about $ and going back to work. Asked open ended questions. "I am depressed, sometimes I don't want to get out of bed." Suggested to pt to call her psychiatrist, Dr. Rene KocherEksir, to discuss her depressive symptoms with him. Pt discussed the reasons she thinks she is currently depressed: $, going back to a job she is unhappy at, not having a career. Discussed her triggers for current depressive symptoms and options to change and suggestions. Pt is dreading the holidays as she continues to have a contentious relationship with her mother and now her sister. Pt still continues to want to control. Discussed with pt who/what she really has control over.       Suicidal/Homicidal: Nowithout intent/plan  Therapist Response: Assessed pt's current functioning and reviewed progress.  Assisted pt  processing relationships, stressors, depressive symptoms, triggers for depression, family, control issues. Assisted pt processing for the management of her stressors.  Plan: Return again in 2 weeks.  Diagnosis: Axis I: Depression and Anxiety   MACKENZIE,LISBETH S, LCAS  06/07/18

## 2018-06-21 ENCOUNTER — Ambulatory Visit (HOSPITAL_COMMUNITY): Payer: 59 | Admitting: Licensed Clinical Social Worker

## 2018-06-22 ENCOUNTER — Ambulatory Visit: Payer: Self-pay | Admitting: Sports Medicine

## 2018-06-24 ENCOUNTER — Encounter: Payer: Self-pay | Admitting: Sports Medicine

## 2018-06-24 ENCOUNTER — Ambulatory Visit (INDEPENDENT_AMBULATORY_CARE_PROVIDER_SITE_OTHER): Payer: Medicaid Other | Admitting: Sports Medicine

## 2018-06-24 DIAGNOSIS — S43015D Anterior dislocation of left humerus, subsequent encounter: Secondary | ICD-10-CM

## 2018-06-24 NOTE — Assessment & Plan Note (Signed)
Returns, she had an anterior shoulder dislocation approximately 18 weeks ago, she had a prominent Hill-Sachs lesion. Did well after the MR arthrogram, continue to work with physical therapy, for the most part pain is gone, range of motion is for the most part symmetric with the contralateral side, negative apprehension sign, no translational instability. I think she can come back to see me on an as-needed basis.
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
## 2018-06-28 ENCOUNTER — Ambulatory Visit (INDEPENDENT_AMBULATORY_CARE_PROVIDER_SITE_OTHER): Payer: Medicaid Other | Admitting: Licensed Clinical Social Worker

## 2018-06-28 DIAGNOSIS — F418 Other specified anxiety disorders: Secondary | ICD-10-CM | POA: Diagnosis not present

## 2018-06-29 ENCOUNTER — Encounter (HOSPITAL_COMMUNITY): Payer: Self-pay | Admitting: Licensed Clinical Social Worker

## 2018-06-29 NOTE — Progress Notes (Signed)
   THERAPIST PROGRESS NOTE  Session Time: 3:10-4pm  Participation Level: Active  Behavioral Response: NeatAlert/Depressed   Type of Therapy: Individual Therapy  Treatment Goals addressed: Improve Psychiatric Symptoms, elevate mood (increased self-esteem, increased self-compassion, increased interaction), improve unhelpful thought patterns, controlled behavior, moderate mood, deliberate speech and thought process(improved social functioning, healthy adjustment to living situation), Learn about diagnosis, healthy coping skills.  Interventions: CBT  Summary: Gwendolyn Bowen is a 38 y.o. female.Pt discussed her psychiatric symptoms and current life events. Pt present depressed today. She is concerned about her return to work on 1/30. She does not want to go back to that job but financially she needs to. She and her fiance discussed it and she going to try. Role played with pt how to talk with her boss about her return to work and assisted her with more effective communication skills. Pt has furiously been filling out applications on line looking for new employment.  Pt has joined [employer] to assist in her weight loss. Discussed her goals with [employer]. Pt discussed her holidays with her mother. Pt is moving further away from her mother's mean-spirited comments and trying to protect herself. Encouraged pt to use her boundaries. Pt still has not called Dr. Rene Kocher about her continued depressive symptoms.       Suicidal/Homicidal: Nowithout intent/plan  Therapist Response: Assessed pt's current functioning and reviewed progress.  Assisted pt  processing relationships, stressors, depressive symptoms, triggers for depression, family, return to work stress. Assisted pt processing for the management of her stressors.  Plan: Return again in 2 weeks.  Diagnosis: Axis I: Depression and Anxiety   Kaheem Halleck S, LCAS  06/29/2018

## 2018-07-05 ENCOUNTER — Ambulatory Visit (HOSPITAL_COMMUNITY): Payer: Self-pay | Admitting: Licensed Clinical Social Worker

## 2018-07-05 ENCOUNTER — Ambulatory Visit (HOSPITAL_COMMUNITY): Payer: 59 | Admitting: Licensed Clinical Social Worker

## 2018-07-08 ENCOUNTER — Ambulatory Visit (HOSPITAL_COMMUNITY): Payer: Self-pay | Admitting: Licensed Clinical Social Worker

## 2018-07-12 ENCOUNTER — Ambulatory Visit (HOSPITAL_COMMUNITY): Payer: 59 | Admitting: Licensed Clinical Social Worker

## 2018-07-19 ENCOUNTER — Ambulatory Visit: Payer: Self-pay | Admitting: Physician Assistant

## 2018-07-19 ENCOUNTER — Ambulatory Visit (HOSPITAL_COMMUNITY): Payer: 59 | Admitting: Licensed Clinical Social Worker

## 2018-07-22 ENCOUNTER — Telehealth: Payer: Self-pay | Admitting: Sports Medicine

## 2018-07-22 ENCOUNTER — Ambulatory Visit: Payer: Medicaid Other | Admitting: Sports Medicine

## 2018-07-22 NOTE — Telephone Encounter (Signed)
I am filling it out now, it is ready for her to pick up, does she still need a work note?

## 2018-07-22 NOTE — Telephone Encounter (Signed)
Called Gwendolyn Bowen to let them know Dr. Benjamin Stainhekkekandam would fill out there FMLA paperwork without needing an appointment.   Gwendolyn Bowen requested for a note to return back to work on Monday if paperwork couldn't be filled out by 07/22/18.  Please advise

## 2018-08-07 ENCOUNTER — Emergency Department (INDEPENDENT_AMBULATORY_CARE_PROVIDER_SITE_OTHER)
Admission: EM | Admit: 2018-08-07 | Discharge: 2018-08-07 | Disposition: A | Discharge status: Home / Self Care | Payer: Medicaid Other | Source: Home / Self Care | Attending: Family Medicine | Admitting: Family Medicine

## 2018-08-07 ENCOUNTER — Other Ambulatory Visit: Payer: Self-pay

## 2018-08-07 ENCOUNTER — Emergency Department (INDEPENDENT_AMBULATORY_CARE_PROVIDER_SITE_OTHER): Payer: Medicaid Other

## 2018-08-07 DIAGNOSIS — R69 Illness, unspecified: Secondary | ICD-10-CM | POA: Diagnosis not present

## 2018-08-07 DIAGNOSIS — R05 Cough: Secondary | ICD-10-CM | POA: Diagnosis not present

## 2018-08-07 DIAGNOSIS — J111 Influenza due to unidentified influenza virus with other respiratory manifestations: Secondary | ICD-10-CM

## 2018-08-07 MED ORDER — BENZONATATE 200 MG PO CAPS: ORAL_CAPSULE | ORAL | 0 refills | Status: DC

## 2018-08-07 MED ORDER — OSELTAMIVIR PHOSPHATE 75 MG PO CAPS: 75.0000 mg | ORAL_CAPSULE | Freq: Two times a day (BID) | ORAL | 0 refills | Status: DC

## 2018-08-07 MED ORDER — ALBUTEROL SULFATE HFA 108 (90 BASE) MCG/ACT IN AERS: 2.0000 | INHALATION_SPRAY | RESPIRATORY_TRACT | 0 refills | Status: DC | PRN
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
## 2018-08-07 NOTE — ED Triage Notes (Signed)
Pt c/o flu like sxs that started yesterday. Fever, cough, bodyaches and chills. Feels very sluggish currently. Hasnt taken any OTC meds.

## 2018-08-07 NOTE — Discharge Instructions (Addendum)
Take plain guaifenesin (1200mg  extended release tabs such as Mucinex) twice daily, with plenty of water, for cough and congestion.  May add Pseudoephedrine (30mg , one or two every 4 to 6 hours) for sinus congestion.  Get adequate rest.   May use Afrin nasal spray (or generic oxymetazoline) each morning for about 5 days and then discontinue.  Also recommend using saline nasal spray several times daily and saline nasal irrigation (AYR is a common brand).   Try warm salt water gargles for sore throat.  Stop all antihistamines for now, and other non-prescription cough/cold preparations. May take Tylenol as needed for fever, headache, etc. If needed may take Delsym Cough Suppressant with Tessalon at bedtime for nighttime cough.

## 2018-08-16 ENCOUNTER — Ambulatory Visit (INDEPENDENT_AMBULATORY_CARE_PROVIDER_SITE_OTHER): Payer: Self-pay | Admitting: Licensed Clinical Social Worker

## 2018-08-16 DIAGNOSIS — F418 Other specified anxiety disorders: Secondary | ICD-10-CM

## 2018-08-17 ENCOUNTER — Encounter (HOSPITAL_COMMUNITY): Payer: Self-pay | Admitting: Licensed Clinical Social Worker

## 2018-08-17 NOTE — Progress Notes (Signed)
   THERAPIST PROGRESS NOTE  Session Time: 11:10-12pm  Participation Level: Active  Behavioral Response: NeatAlert/Depressed   Type of Therapy: Individual Therapy  Treatment Goals addressed: Improve Psychiatric Symptoms, elevate mood (increased self-esteem, increased self-compassion, increased interaction), improve unhelpful thought patterns, controlled behavior, moderate mood, deliberate speech and thought process(improved social functioning, healthy adjustment to living situation), Learn about diagnosis, healthy coping skills.  Interventions: CBT  Summary: BLANCH BLOK is a 38 y.o. female.Pt discussed her psychiatric symptoms and current life events. Pt present depressed and tearful today. She has not been to therapy since the first of January, missing appointments. Discussed her committment to therapy. Pt begins crying. She explains: "ive been evicted, im back at work,, we have nowhere to move to, I don't make enough $, no child support, I didn't have $ for gas to come to appointments."  Processed each issue pt is experiencing assisting her with compartmentalization.     Suicidal/Homicidal: Nowithout intent/plan  Therapist Response: Assessed pt's current functioning and reviewed progress.  Assisted pt  processing stressors, depressive symptoms,  Compartmentalization, commitment to therapy. Assisted pt processing for the management of her stressors.  Plan: Return again in 1 weeks.  Diagnosis: Axis I: Depression and Anxiety   Poseidon Pam S, LCAS  08/16/2018

## 2018-08-23 ENCOUNTER — Encounter (HOSPITAL_COMMUNITY): Payer: Self-pay | Admitting: Licensed Clinical Social Worker

## 2018-08-23 ENCOUNTER — Ambulatory Visit (INDEPENDENT_AMBULATORY_CARE_PROVIDER_SITE_OTHER): Payer: Medicaid Other | Admitting: Licensed Clinical Social Worker

## 2018-08-23 DIAGNOSIS — F418 Other specified anxiety disorders: Secondary | ICD-10-CM | POA: Diagnosis not present

## 2018-08-23 NOTE — Progress Notes (Signed)
   THERAPIST PROGRESS NOTE  Session Time: 11:10-12pm  Participation Level: Active  Behavioral Response: NeatAlert/Depressed   Type of Therapy: Individual Therapy  Treatment Goals addressed: Improve Psychiatric Symptoms, elevate mood (increased self-esteem, increased self-compassion, increased interaction), improve unhelpful thought patterns, controlled behavior, moderate mood, deliberate speech and thought process(improved social functioning, healthy adjustment to living situation), Learn about diagnosis, healthy coping skills.  Interventions: CBT/TX plan review  Summary: Gwendolyn Bowen is a 38 y.o. female who presents for her individual counseling session.Pt discussed her psychiatric symptoms and current life events. Reviewed pt'Bowen tx plan. Pt continues feeling depressed. She is moving tomorrow. Having found a house to move into. Pt continues with the same stressors. Used behavior activation with pt with worksheet and discussion. Gave homework assignment: Identify 3 changes she want to make in her life.       Suicidal/Homicidal: Nowithout intent/plan  Therapist Response: Assessed pt'Bowen current functioning and reviewed progress.  Assisted pt  processing stressors, depressive symptoms,  Behavior activation. Assisted pt processing for the management of her stressors.  Plan: Return again in 1 weeks. Gave homework assignment: Identify 3 changes she want to make in her life.    Diagnosis: Axis I: Depression and Anxiety   Gwendolyn Bowen,Gwendolyn Bowen, LCAS  08/23/2018

## 2018-08-30 ENCOUNTER — Ambulatory Visit (INDEPENDENT_AMBULATORY_CARE_PROVIDER_SITE_OTHER): Payer: Medicaid Other | Admitting: Licensed Clinical Social Worker

## 2018-08-30 ENCOUNTER — Encounter (HOSPITAL_COMMUNITY): Payer: Self-pay | Admitting: Licensed Clinical Social Worker

## 2018-08-30 DIAGNOSIS — F418 Other specified anxiety disorders: Secondary | ICD-10-CM | POA: Diagnosis not present

## 2018-08-30 NOTE — Progress Notes (Signed)
   THERAPIST PROGRESS NOTE  Session Time: 1:10-2pm  Participation Level: Active  Behavioral Response: NeatAlert/Depressed   Type of Therapy: Individual Therapy  Treatment Goals addressed: Improve Psychiatric Symptoms, elevate mood (increased self-esteem, increased self-compassion, increased interaction), improve unhelpful thought patterns, controlled behavior, moderate mood, deliberate speech and thought process(improved social functioning, healthy adjustment to living situation), Learn about diagnosis, healthy coping skills.  Interventions: CBT/Behavioral Activation  Summary: Gwendolyn Bowen is a 38 y.o. female who presents for her individual counseling session.Pt discussed her psychiatric symptoms and current life events. Pt and her family are living in a hotel due to not finding suitable housing. Pt reports, "Im becoming more humble." Asked open ended questions and used empathic reflection. Pt identified areas she would like to change: be less opinionated, be more happy with self, be more thoughtful of others and less self centered. Suggested to pt to complete a daily gratitude list, she has a lot to be grateful of. Continued education of behavioral activation with homework sheet and weekly schedule.    Suicidal/Homicidal: Nowithout intent/plan  Therapist Response: Assessed pt's current functioning and reviewed progress.  Assisted pt  processing stressors, depressive symptoms,  Behavior activation, living situation, gratitude list. Assisted pt processing for the management of her stressors.  Plan: Return again in 1 week. Gave homework assignment: Behavior activation worksheet and weekly schedule.   Diagnosis: Axis I: Depression and Anxiety   MACKENZIE,LISBETH S, LCAS  08/30/2018

## 2018-09-06 ENCOUNTER — Encounter (HOSPITAL_COMMUNITY): Payer: Self-pay | Admitting: Licensed Clinical Social Worker

## 2018-09-06 ENCOUNTER — Ambulatory Visit (INDEPENDENT_AMBULATORY_CARE_PROVIDER_SITE_OTHER): Payer: Medicaid Other | Admitting: Licensed Clinical Social Worker

## 2018-09-06 ENCOUNTER — Other Ambulatory Visit: Payer: Self-pay

## 2018-09-06 DIAGNOSIS — F418 Other specified anxiety disorders: Secondary | ICD-10-CM | POA: Diagnosis not present

## 2018-09-06 NOTE — Progress Notes (Signed)
   THERAPIST PROGRESS NOTE  Session Time: 10:10-11am  Participation Level: Active  Behavioral Response: NeatAlert/Depressed   Type of Therapy: Individual Therapy  Treatment Goals addressed: Improve Psychiatric Symptoms, elevate mood (increased self-esteem, increased self-compassion, increased interaction), improve unhelpful thought patterns, controlled behavior, moderate mood, deliberate speech and thought process(improved social functioning, healthy adjustment to living situation), Learn about diagnosis, healthy coping skills.  Interventions: CBT/Behavioral Activation  Summary: Gwendolyn Bowen is a 38 y.o. female who presents for her individual counseling session.Pt discussed her psychiatric symptoms and current life events. Pt and her family continue to live in a hotel due to not finding suitable housing. Pt reports, "im probably going to be terminated from my job this week." Asked open ended questions and used empathic reflection.Pt did not bring back her Behavioral activation homework. Suggested she bring it to next session. Discussed the importance of  Self care and self soothing during this tulmultuous time. Gave pt handout on self soothing. Pt reports, "im overwhelmed," Educated pt on compartmentalizing. Pt brought her journal and read from it. Suggested to pt the importance of journaling.   Suicidal/Homicidal: Nowithout intent/plan  Therapist Response: Assessed pt's current functioning and reviewed progress.  Assisted pt  processing stressors, depressive symptoms,  Behavior activation homework,copartmentalizeing, living situation, journaling. Assisted pt processing for the management of her stressors.  Plan: Return again in 1 week. Gave homework assignment: Behavior activation worksheet and weekly schedule. Gratitude list. Journal.   Diagnosis: Axis I: Depression and Anxiety   [name], LCAS  09/06/2018

## 2018-09-13 ENCOUNTER — Other Ambulatory Visit: Payer: Self-pay

## 2018-09-13 ENCOUNTER — Encounter (HOSPITAL_COMMUNITY): Payer: Self-pay | Admitting: Licensed Clinical Social Worker

## 2018-09-13 ENCOUNTER — Ambulatory Visit (INDEPENDENT_AMBULATORY_CARE_PROVIDER_SITE_OTHER): Payer: Medicaid Other | Admitting: Licensed Clinical Social Worker

## 2018-09-13 DIAGNOSIS — F418 Other specified anxiety disorders: Secondary | ICD-10-CM

## 2018-09-13 NOTE — Progress Notes (Signed)
   THERAPIST PROGRESS NOTE  Session Time: 10:10-11am  Participation Level: Active  Behavioral Response: NeatAlert/Depressed   Type of Therapy: Individual Therapy  Treatment Goals addressed: Improve Psychiatric Symptoms, elevate mood (increased self-esteem, increased self-compassion, increased interaction), improve unhelpful thought patterns, controlled behavior, moderate mood, deliberate speech and thought process(improved social functioning, healthy adjustment to living situation), Learn about diagnosis, healthy coping skills.  Interventions: CBT/Behavioral Activation  Summary: Gwendolyn Bowen is a 38 y.o. female who presents for her individual counseling session.Pt discussed her psychiatric symptoms and current life events.  Pt presents obviously depressed today. Pt and her family continue to live in a hotel due to not finding suitable housing, but they have a lead on a house with a co-signer. Pt is presenting less humble and more combative. Asked open ended questions. Pt had a web-ex session with her psychiatrist today. He increased her one of her medications, she can't remember which one. Pt described her strategies for find a new place to live. Reviewed strategies with pt. Reviewed behavioral activation homework with pt. Discussed with pt the importance of gratitude. Pt was receptive to suggestions. Discussed Webex with pt. [facility] will start therapy session through Webex at next appt. She as in agreement of this.   Suicidal/Homicidal: Nowithout intent/plan  Therapist Response: Assessed pt's current functioning and reviewed progress.  Assisted pt  processing stressors, depressive symptoms,  Behavior activation homework,living situation, journaling, gratitudes. Assisted pt processing for the management of her stressors.  Plan: Return again in 1 week.  Gratitude list. Journal.   Diagnosis: Axis I: Depression and Anxiety   Breckon Reeves S, LCAS  09/13/2018

## 2018-09-20 ENCOUNTER — Other Ambulatory Visit: Payer: Self-pay

## 2018-09-20 ENCOUNTER — Ambulatory Visit (HOSPITAL_COMMUNITY): Payer: Medicaid Other | Admitting: Licensed Clinical Social Worker

## 2018-09-21 ENCOUNTER — Ambulatory Visit (INDEPENDENT_AMBULATORY_CARE_PROVIDER_SITE_OTHER): Payer: Medicaid Other | Admitting: Licensed Clinical Social Worker

## 2018-09-21 ENCOUNTER — Other Ambulatory Visit: Payer: Self-pay

## 2018-09-21 DIAGNOSIS — F418 Other specified anxiety disorders: Secondary | ICD-10-CM

## 2018-09-26 ENCOUNTER — Encounter (HOSPITAL_COMMUNITY): Payer: Self-pay | Admitting: Licensed Clinical Social Worker
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
## 2018-09-27 ENCOUNTER — Encounter (HOSPITAL_COMMUNITY): Payer: Self-pay | Admitting: Licensed Clinical Social Worker

## 2018-09-27 ENCOUNTER — Other Ambulatory Visit: Payer: Self-pay

## 2018-09-27 ENCOUNTER — Ambulatory Visit (INDEPENDENT_AMBULATORY_CARE_PROVIDER_SITE_OTHER): Payer: Medicaid Other | Admitting: Licensed Clinical Social Worker

## 2018-09-27 DIAGNOSIS — F418 Other specified anxiety disorders: Secondary | ICD-10-CM
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
## 2018-10-04 ENCOUNTER — Ambulatory Visit (INDEPENDENT_AMBULATORY_CARE_PROVIDER_SITE_OTHER): Payer: Medicaid Other | Admitting: Licensed Clinical Social Worker

## 2018-10-04 ENCOUNTER — Other Ambulatory Visit: Payer: Self-pay

## 2018-10-04 ENCOUNTER — Encounter (HOSPITAL_COMMUNITY): Payer: Self-pay | Admitting: Licensed Clinical Social Worker

## 2018-10-04 DIAGNOSIS — F418 Other specified anxiety disorders: Secondary | ICD-10-CM

## 2018-10-04 NOTE — Progress Notes (Signed)
Virtual Visit via Video Note  I connected with Gwendolyn Bowen on 10/04/18 at 10:00 AM EDT by a video enabled telemedicine application and verified that I am speaking with the correct person using two identifiers.   I discussed the limitations of evaluation and management by telemedicine and the availability of in person appointments. The patient expressed understanding and agreed to proceed.  History of Present Illness:  Pt was referred to therapy by psychiatrist for depression and anxiety.    Observations/Objective: Verbalize hopeful and positive statements regarding the future.Pt presents anxious and depressed via webex therapy session.   Assessment and Plan: Pt presented anxious and depressed, tearful during the session. She and her x husband are still feuding over the custody of the children. She tried to see her daughters over Easter. Her husband has contacted an attorney with a temporary custody arrangement. She was angry in her discussion about the arrangement. Asked pt open ended questions about her tone of voice, her actions by showing up at her x's house and the fear her children are experiencing. Educated pt on her impulsive behavior which leads to combative behavior with her x. Engaged pt in the Stop, think listen before acting. Continue to see pt weekly.   Follow Up Instructions:   I discussed the assessment and treatment plan with the patient. The patient was provided an opportunity to ask questions and all were answered. The patient agreed with the plan and demonstrated an understanding of the instructions.   The patient was advised to call back or seek an in-person evaluation if the symptoms worsen or if the condition fails to improve as anticipated.  I provided 50 minutes of non-face-to-face time during this encounter.   Caedin Mogan S, LCAS

## 2018-10-11 ENCOUNTER — Encounter (HOSPITAL_COMMUNITY): Payer: Self-pay | Admitting: Licensed Clinical Social Worker

## 2018-10-11 ENCOUNTER — Ambulatory Visit (INDEPENDENT_AMBULATORY_CARE_PROVIDER_SITE_OTHER): Payer: Medicaid Other | Admitting: Licensed Clinical Social Worker

## 2018-10-11 ENCOUNTER — Other Ambulatory Visit: Payer: Self-pay

## 2018-10-11 DIAGNOSIS — F418 Other specified anxiety disorders: Secondary | ICD-10-CM

## 2018-10-11 NOTE — Progress Notes (Signed)
Virtual Visit via Video Note  I connected with Gwendolyn Bowen on 10/11/18 at 10:00 AM EDT by a video enabled telemedicine application and verified that I am speaking with the correct person using two identifiers.   I discussed the limitations of evaluation and management by telemedicine and the availability of in person appointments. The patient expressed understanding and agreed to proceed.  History of Present Illness:  Pt was referred to therapy by psychiatrist for depression and anxiety.    Observations/Objective: Verbalize hopeful and positive statements regarding the future.Pt presents anxious and depressed via webex therapy session.   Assessment and Plan: Pt presented anxious and depressed, tearful during the session. She and her x husband are still feuding over the custody of the children. Asked open ended questions. Suggestions of effective communication skills: aggressive vs assertive, tone of voice. Role played again with pt., making suggestions.  Role played stop, think and act. Encouraged pt to exercise to address some of her aggression, journal her validated and processed her feelings. Assisted pt reducing her negative thoughts that trigger her impulsive behavior and increase her positive self talk that controls behavior.   Follow Up Instructions:   I discussed the assessment and treatment plan with the patient. The patient was provided an opportunity to ask questions and all were answered. The patient agreed with the plan and demonstrated an understanding of the instructions.   The patient was advised to call back or seek an in-person evaluation if the symptoms worsen or if the condition fails to improve as anticipated.  I provided 50 minutes of non-face-to-face time during this encounter.   Gwendolyn Bowen S, LCAS

## 2018-10-18 ENCOUNTER — Other Ambulatory Visit: Payer: Self-pay

## 2018-10-18 ENCOUNTER — Encounter (HOSPITAL_COMMUNITY): Payer: Self-pay | Admitting: Licensed Clinical Social Worker

## 2018-10-18 ENCOUNTER — Ambulatory Visit (INDEPENDENT_AMBULATORY_CARE_PROVIDER_SITE_OTHER): Payer: Medicaid Other | Admitting: Licensed Clinical Social Worker

## 2018-10-18 DIAGNOSIS — F418 Other specified anxiety disorders: Secondary | ICD-10-CM | POA: Diagnosis not present

## 2018-10-18 NOTE — Progress Notes (Signed)
Virtual Visit via Video Note  I connected with Gwendolyn Bowen on 10/18/18 at 10:00 AM EDT by a video enabled telemedicine application and verified that I am speaking with the correct person using two identifiers.   I discussed the limitations of evaluation and management by telemedicine and the availability of in person appointments. The patient expressed understanding and agreed to proceed.  History of Present Illness:  Pt was referred to therapy by psychiatrist for depression and anxiety.    Observations/Objective: Verbalize hopeful and positive statements regarding the future.Pt presents anxious and depressed via webex therapy session.   Assessment and Plan: Pt presented anxious and depressed, tearful during the webex session.  She discussed her psychiatric symptoms and current life events. "i'm extremely anxious, the meditation between me and my x husband." Role played scenarios of the meditation, role played voice tones, practiced breathing exercises, role played communication skills, I-statements, Taught pt meditative skills.   Follow Up Instructions:   I discussed the assessment and treatment plan with the patient. The patient was provided an opportunity to ask questions and all were answered. The patient agreed with the plan and demonstrated an understanding of the instructions.   The patient was advised to call back or seek an in-person evaluation if the symptoms worsen or if the condition fails to improve as anticipated.  I provided 50 minutes of non-face-to-face time during this encounter.   Cimberly Stoffel S, LCAS

## 2018-10-25 ENCOUNTER — Ambulatory Visit (INDEPENDENT_AMBULATORY_CARE_PROVIDER_SITE_OTHER): Payer: Medicaid Other | Admitting: Licensed Clinical Social Worker

## 2018-10-25 ENCOUNTER — Encounter (HOSPITAL_COMMUNITY): Payer: Self-pay | Admitting: Licensed Clinical Social Worker

## 2018-10-25 ENCOUNTER — Other Ambulatory Visit: Payer: Self-pay

## 2018-10-25 DIAGNOSIS — F418 Other specified anxiety disorders: Secondary | ICD-10-CM

## 2018-10-25 NOTE — Progress Notes (Signed)
Virtual Visit via Video Note  I connected with Gwendolyn Bowen on 10/25/18 at  9:00 AM EDT by a video enabled telemedicine application and verified that I am speaking with the correct person using two identifiers.   I discussed the limitations of evaluation and management by telemedicine and the availability of in person appointments. The patient expressed understanding and agreed to proceed.  History of Present Illness:  Pt was referred to therapy by psychiatrist for depression and anxiety.    Observations/Objective: Verbalize signs and symptoms of depression. Pt presents depressed via webex therapy session.    Assessment and Plan: Pt presented  depressed, tearful during the webex session.  She discussed her psychiatric symptoms and current life events. "i'm depressed." discussed signs and symptoms of depression. Discussed the importance of scheduling. Assisted pt identifying what to schedule in her day. Asked open ended questions: If you want to change, what are you willing to do to change? Pt reports the mediation went well and the custody arrangement will go back to normal after the pandemic is over. Pt was pleased with the outcome of the mediation.   Follow Up Instructions:   I discussed the assessment and treatment plan with the patient. The patient was provided an opportunity to ask questions and all were answered. The patient agreed with the plan and demonstrated an understanding of the instructions.   The patient was advised to call back or seek an in-person evaluation if the symptoms worsen or if the condition fails to improve as anticipated.  I provided 60 minutes of non-face-to-face time during this encounter.   MACKENZIE,LISBETH S, LCAS

## 2018-11-01 ENCOUNTER — Encounter (HOSPITAL_COMMUNITY): Payer: Self-pay | Admitting: Licensed Clinical Social Worker

## 2018-11-01 ENCOUNTER — Ambulatory Visit (INDEPENDENT_AMBULATORY_CARE_PROVIDER_SITE_OTHER): Payer: Medicaid Other | Admitting: Licensed Clinical Social Worker

## 2018-11-01 ENCOUNTER — Other Ambulatory Visit: Payer: Self-pay

## 2018-11-01 DIAGNOSIS — F418 Other specified anxiety disorders: Secondary | ICD-10-CM | POA: Diagnosis not present

## 2018-11-01 NOTE — Progress Notes (Signed)
Virtual Visit via Video Note  I connected with Gwendolyn Bowen on 11/01/18 at  9:00 AM EDT by a video enabled telemedicine application and verified that I am speaking with the correct person using two identifiers.   I discussed the limitations of evaluation and management by telemedicine and the availability of in person appointments. The patient expressed understanding and agreed to proceed.  History of Present Illness:  Pt was referred to therapy by psychiatrist for depression and anxiety.    Observations/Objective: Verbalize signs and symptoms of depression. Pt presents depressed via webex therapy session.    Assessment and Plan: Pt presented  depressed during the webex session.  She discussed her psychiatric symptoms and current life events. "I had a good mother's day, but I'm still depressed." Asked open ended questions using empathic reflection. Coached pt on protective and resiliency factors. Emailed pt worksheet and informational sheet.  *scheduling and goal planning   Follow Up Instructions:   I discussed the assessment and treatment plan with the patient. The patient was provided an opportunity to ask questions and all were answered. The patient agreed with the plan and demonstrated an understanding of the instructions.   The patient was advised to call back or seek an in-person evaluation if the symptoms worsen or if the condition fails to improve as anticipated.  I provided 60 minutes of non-face-to-face time during this encounter.   MACKENZIE,LISBETH S, LCAS

## 2018-11-08 ENCOUNTER — Ambulatory Visit (HOSPITAL_COMMUNITY): Payer: Medicaid Other | Admitting: Licensed Clinical Social Worker

## 2018-11-08 ENCOUNTER — Other Ambulatory Visit: Payer: Self-pay

## 2018-11-17 ENCOUNTER — Other Ambulatory Visit: Payer: Self-pay

## 2018-11-17 ENCOUNTER — Ambulatory Visit (INDEPENDENT_AMBULATORY_CARE_PROVIDER_SITE_OTHER): Payer: Medicaid Other | Admitting: Licensed Clinical Social Worker

## 2018-11-17 ENCOUNTER — Encounter (HOSPITAL_COMMUNITY): Payer: Self-pay | Admitting: Licensed Clinical Social Worker

## 2018-11-17 DIAGNOSIS — F418 Other specified anxiety disorders: Secondary | ICD-10-CM

## 2018-11-17 NOTE — Progress Notes (Signed)
Virtual Visit via Video Note  I connected with Gwendolyn Bowen on 11/17/18 at 10:00 AM EDT by a video enabled telemedicine application and verified that I am speaking with the correct person using two identifiers.   I discussed the limitations of evaluation and management by telemedicine and the availability of in person appointments. The patient expressed understanding and agreed to proceed.  History of Present Illness:  Pt was referred to therapy by psychiatrist for depression and anxiety.    Observations/Objective: Verbalize signs and symptoms of depression. Pt presents depressed via webex therapy session.    Assessment and Plan: Pt presented  depressed during the webex session.  She discussed her psychiatric symptoms and current life events.  Patient described her depressive symptoms. Assisted patient using CBT concepts discussing her thoughts, feelings and actions. Used behavioral activation in assisting patient with her depressive symptoms. Patient read from her journal.   Homework: STOPP, CBT practice worksheets, Core Beliefs worsheet   Follow Up Instructions:   I discussed the assessment and treatment plan with the patient. The patient was provided an opportunity to ask questions and all were answered. The patient agreed with the plan and demonstrated an understanding of the instructions.   The patient was advised to call back or seek an in-person evaluation if the symptoms worsen or if the condition fails to improve as anticipated.  I provided 60 minutes of non-face-to-face time during this encounter.   MACKENZIE,LISBETH S, LCAS

## 2018-11-22 ENCOUNTER — Ambulatory Visit (INDEPENDENT_AMBULATORY_CARE_PROVIDER_SITE_OTHER): Payer: Medicaid Other | Admitting: Licensed Clinical Social Worker

## 2018-11-22 ENCOUNTER — Other Ambulatory Visit: Payer: Self-pay

## 2018-11-22 ENCOUNTER — Encounter (HOSPITAL_COMMUNITY): Payer: Self-pay | Admitting: Licensed Clinical Social Worker

## 2018-11-22 DIAGNOSIS — F418 Other specified anxiety disorders: Secondary | ICD-10-CM

## 2018-11-22 NOTE — Progress Notes (Signed)
Virtual Visit via Video Note  I connected with Gwendolyn Bowen on 11/22/18 at 10:00 AM EDT by a video enabled telemedicine application and verified that I am speaking with the correct person using two identifiers.   I discussed the limitations of evaluation and management by telemedicine and the availability of in person appointments. The patient expressed understanding and agreed to proceed.  History of Present Illness:  Pt was referred to therapy by psychiatrist for depression and anxiety.    Observations/Objective: Verbalize signs and symptoms of depression. Pt presents depressed via webex therapy session.    Assessment and Plan: Pt presented  depressed and anxious during the webex session.  She discussed her psychiatric symptoms and current life events.  Patient described her depressive symptoms. "I want to change myself but I don't know how." Educated patient on the steps to behavior change. Discussed the emailed worksheets sent last week. "I didn't get to them." Discussed with pt the importance of following through with suggestions, working on concepts and completing worksheets to benefit behavior change as an identified goal by patient. Patient read from her journal. Validated her feelings.     Homework: STOPP, CBT practice worksheets, Core Beliefs worsheet   Follow Up Instructions:   I discussed the assessment and treatment plan with the patient. The patient was provided an opportunity to ask questions and all were answered. The patient agreed with the plan and demonstrated an understanding of the instructions.   The patient was advised to call back or seek an in-person evaluation if the symptoms worsen or if the condition fails to improve as anticipated.  I provided 60 minutes of non-face-to-face time during this encounter.   MACKENZIE,LISBETH S, LCAS

## 2018-11-29 ENCOUNTER — Other Ambulatory Visit: Payer: Self-pay

## 2018-11-29 ENCOUNTER — Ambulatory Visit (HOSPITAL_COMMUNITY): Payer: Medicaid Other | Admitting: Licensed Clinical Social Worker

## 2018-12-06 ENCOUNTER — Other Ambulatory Visit: Payer: Self-pay

## 2018-12-06 ENCOUNTER — Ambulatory Visit (INDEPENDENT_AMBULATORY_CARE_PROVIDER_SITE_OTHER): Payer: Medicaid Other | Admitting: Licensed Clinical Social Worker

## 2018-12-06 ENCOUNTER — Encounter (HOSPITAL_COMMUNITY): Payer: Self-pay | Admitting: Licensed Clinical Social Worker

## 2018-12-06 DIAGNOSIS — F418 Other specified anxiety disorders: Secondary | ICD-10-CM

## 2018-12-06 NOTE — Progress Notes (Signed)
Virtual Visit via Video Note  I connected with Gwendolyn Bowen on 12/06/18 at 10:00 AM EDT by a video enabled telemedicine application and verified that I am speaking with the correct person using two identifiers.   I discussed the limitations of evaluation and management by telemedicine and the availability of in person appointments. The patient expressed understanding and agreed to proceed.  History of Present Illness:  Pt was referred to therapy by psychiatrist for depression and anxiety.    Observations/Objective: Verbalize signs and symptoms of depression. Pt presents depressed via webex therapy session.    Assessment and Plan: Pt presented  depressed and anxious during the webex session.  She discussed her psychiatric symptoms and current life events. "I've had a headache for a week." Discussed missing her last appointment and previous appointments and policy of [facility]. Pt continues to struggle with her daughters. "Ive spoiled them, and now they won't take no for an answer. " Discussed parenting skills, follow through with punishment, and consistency. Assisted pt using CBT techniques to assist her to focus on challenging and changing her unhelpful cognitive distortions and behaviors, improve her emotional regulation, and assist her in the development of personal coping strategies that target solving current problems. Emailed patient worksheets and informational sheets on CBT for review and discuss at next session.   Homework: STOPP, CBT practice worksheets, Core Beliefs worsheet   Follow Up Instructions:   I discussed the assessment and treatment plan with the patient. The patient was provided an opportunity to ask questions and all were answered. The patient agreed with the plan and demonstrated an understanding of the instructions.   The patient was advised to call back or seek an in-person evaluation if the symptoms worsen or if the condition fails to improve as anticipated.  I provided  60 minutes of non-face-to-face time during this encounter.   Rondel Episcopo S, LCAS

## 2018-12-13 ENCOUNTER — Ambulatory Visit (INDEPENDENT_AMBULATORY_CARE_PROVIDER_SITE_OTHER): Payer: Medicaid Other | Admitting: Licensed Clinical Social Worker

## 2018-12-13 ENCOUNTER — Encounter (HOSPITAL_COMMUNITY): Payer: Self-pay | Admitting: Licensed Clinical Social Worker

## 2018-12-13 ENCOUNTER — Other Ambulatory Visit: Payer: Self-pay

## 2018-12-13 DIAGNOSIS — F418 Other specified anxiety disorders: Secondary | ICD-10-CM

## 2018-12-13 NOTE — Progress Notes (Signed)
Virtual Visit via Video Note  I connected with Reather Steller Simcoe on 12/13/18 at 10:00 AM EDT by a video enabled telemedicine application and verified that I am speaking with the correct person using two identifiers.   I discussed the limitations of evaluation and management by telemedicine and the availability of in person appointments. The patient expressed understanding and agreed to proceed.  History of Present Illness:  Pt was referred to therapy by psychiatrist for depression and anxiety.    Observations/Objective: Verbalize signs and symptoms of depression. Pt presents depressed via webex therapy session.    Assessment and Plan: Pt presented  depressed and anxious during the webex session.  She discussed her psychiatric symptoms and current life events.  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 I printed out the worksheets you sent me and reviewed them. " Coached pt on studying for standardized testing. Reviewed "impulsivity handout with pt and educated pt on impulsive behavior and coping skills for managing impulsiveness.   PLAN: CCA  Homework: STOPP, CBT practice worksheets, Core Beliefs worsheet   Follow Up Instructions:   I discussed the assessment and treatment plan with the patient. The patient was provided an opportunity to ask questions and all were answered. The patient agreed with the plan and demonstrated an understanding of the instructions.   The patient was advised to call back or seek an in-person evaluation if the symptoms worsen or if the condition fails to improve as anticipated.  I provided 60 minutes of non-face-to-face time during this encounter.   MACKENZIE,LISBETH S, LCAS

## 2018-12-17 ENCOUNTER — Other Ambulatory Visit: Payer: Self-pay

## 2018-12-17 ENCOUNTER — Ambulatory Visit (INDEPENDENT_AMBULATORY_CARE_PROVIDER_SITE_OTHER): Payer: Medicaid Other | Admitting: Physician Assistant

## 2018-12-17 ENCOUNTER — Encounter: Payer: Self-pay | Admitting: Physician Assistant

## 2018-12-17 ENCOUNTER — Telehealth: Payer: Self-pay | Admitting: Physician Assistant

## 2018-12-17 VITALS — BP 116/74 | HR 94 | Temp 98.1°F | Wt 173.0 lb

## 2018-12-17 DIAGNOSIS — G43709 Chronic migraine without aura, not intractable, without status migrainosus: Secondary | ICD-10-CM | POA: Diagnosis not present

## 2018-12-17 DIAGNOSIS — E559 Vitamin D deficiency, unspecified: Secondary | ICD-10-CM

## 2018-12-17 DIAGNOSIS — IMO0002 Reserved for concepts with insufficient information to code with codable children: Secondary | ICD-10-CM

## 2018-12-17 DIAGNOSIS — R635 Abnormal weight gain: Secondary | ICD-10-CM | POA: Diagnosis not present

## 2018-12-17 DIAGNOSIS — Z13220 Encounter for screening for lipoid disorders: Secondary | ICD-10-CM

## 2018-12-17 DIAGNOSIS — Z131 Encounter for screening for diabetes mellitus: Secondary | ICD-10-CM

## 2018-12-17 DIAGNOSIS — E6609 Other obesity due to excess calories: Secondary | ICD-10-CM | POA: Diagnosis not present

## 2018-12-17 DIAGNOSIS — Z130 Encounter for screening for diseases of the blood and blood-forming organs and certain disorders involving the immune mechanism: Secondary | ICD-10-CM

## 2018-12-17 DIAGNOSIS — Z1329 Encounter for screening for other suspected endocrine disorder: Secondary | ICD-10-CM

## 2018-12-17 MED ORDER — TOPIRAMATE ER 200 MG PO CAP24: 200.0000 mg | ORAL_CAPSULE | Freq: Every day | ORAL | 0 refills | Status: DC

## 2018-12-17 MED ORDER — LOMAIRA 8 MG PO TABS: 8.0000 mg | ORAL_TABLET | Freq: Every day | ORAL | 0 refills | Status: DC

## 2018-12-17 MED ORDER — GABAPENTIN 300 MG PO CAPS: 300.0000 mg | ORAL_CAPSULE | Freq: Every day | ORAL | 0 refills | Status: DC
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
## 2018-12-17 NOTE — Telephone Encounter (Signed)
Received fax from [employer]eds that [employer]  requires a PA. Information has been sent to the insurance company. Awaiting determination.

## 2018-12-20 ENCOUNTER — Ambulatory Visit (INDEPENDENT_AMBULATORY_CARE_PROVIDER_SITE_OTHER): Payer: Medicaid Other | Admitting: Licensed Clinical Social Worker

## 2018-12-20 ENCOUNTER — Encounter (HOSPITAL_COMMUNITY): Payer: Self-pay | Admitting: Licensed Clinical Social Worker

## 2018-12-20 ENCOUNTER — Other Ambulatory Visit: Payer: Self-pay

## 2018-12-20 DIAGNOSIS — F418 Other specified anxiety disorders: Secondary | ICD-10-CM | POA: Diagnosis not present
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
## 2018-12-26 IMAGING — MR MR SHOULDER*L* W/ CM
5 series · 40 of 40 positions shown · IV contrast (agent unspecified)
Comparison: None.

CLINICAL DATA: Anterior shoulder dislocation after fall 6 months
ago. Limited painful range of motion. Difficulty raising arm above
head. No prior surgeries.

EXAM:
MR ARTHROGRAM OF THE LEFT SHOULDER
TECHNIQUE: Multiplanar, multisequence MR imaging of the left shoulder was
performed following the administration of intra-articular contrast.
CONTRAST:  See Injection Documentation.

[Series 3: T1 fat-sat · axial · 4.0mm · 0.47mm/px · z∈[-13,+79]mm · 8 of 22 slices shown (1 of 3)]
[im 1/22]
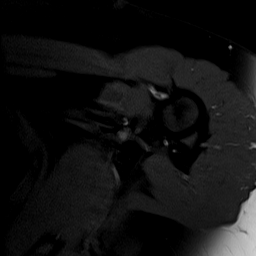
[im 4/22]
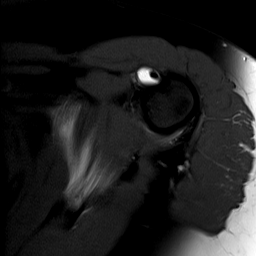
[im 7/22]
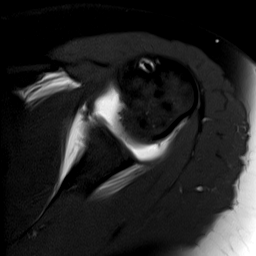
[im 10/22]
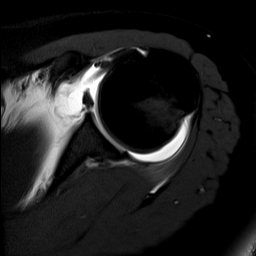
[im 13/22]
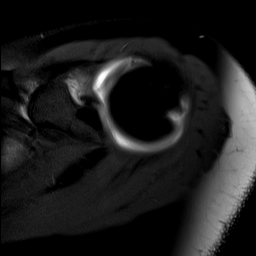
[im 16/22]
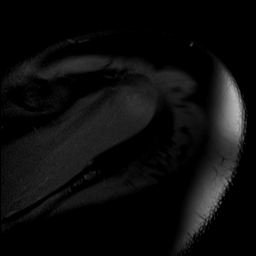
[im 19/22]
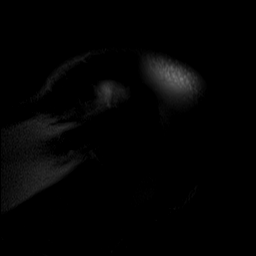
[im 22/22]
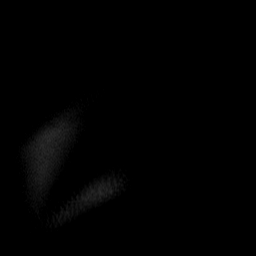

[Series 4: T1 fat-sat · oblique · 4.0mm · 0.55mm/px · 8 of 25 slices shown (2 of 3)]
[im 1/25]
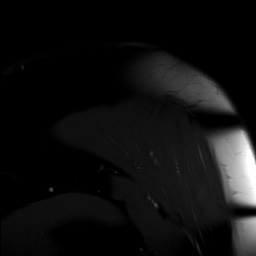
[im 4/25]
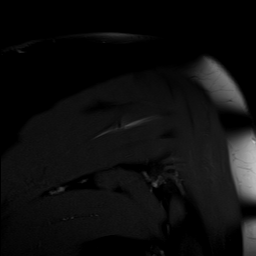
[im 7/25]
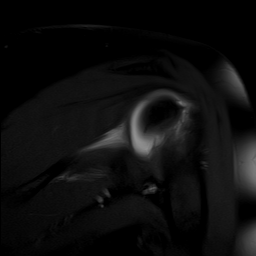
[im 11/25]
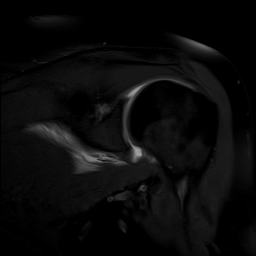
[im 14/25]
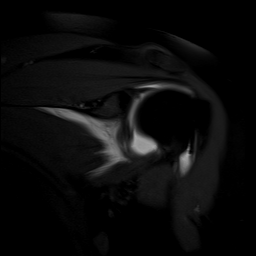
[im 18/25]
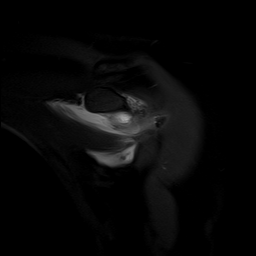
[im 21/25]
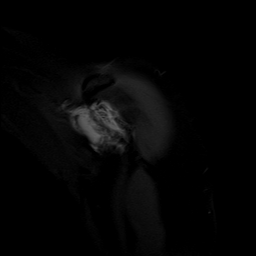
[im 25/25]
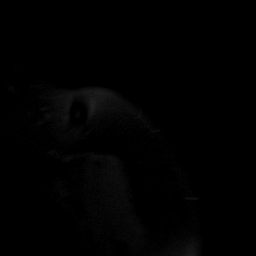

[Series 5: T2 fat-sat · oblique · 4.0mm · 0.55mm/px · 8 of 25 slices shown (1 of 2)]
[im 1/25]
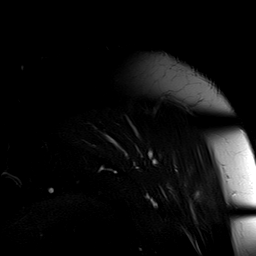
[im 4/25]
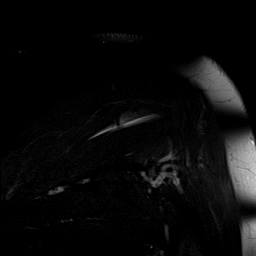
[im 7/25]
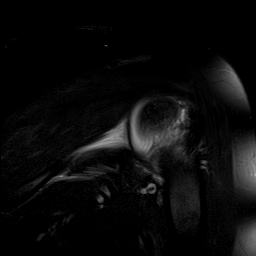
[im 11/25]
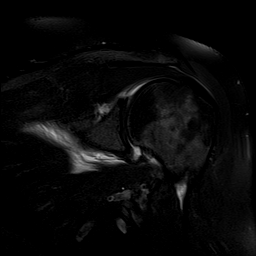
[im 14/25]
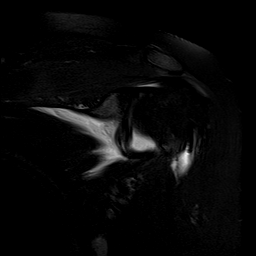
[im 18/25]
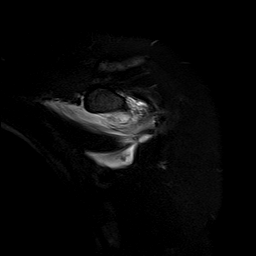
[im 21/25]
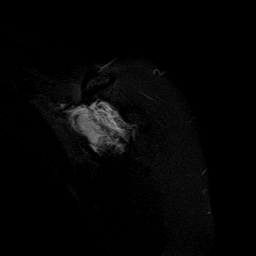
[im 25/25]
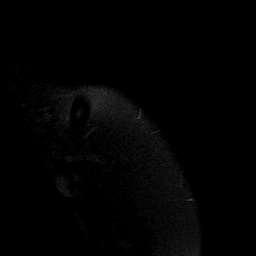

[Series 6: T1 fat-sat · oblique · non-contrast · 4.0mm · 0.44mm/px · 8 of 25 slices shown (3 of 3)]
[im 1/25]
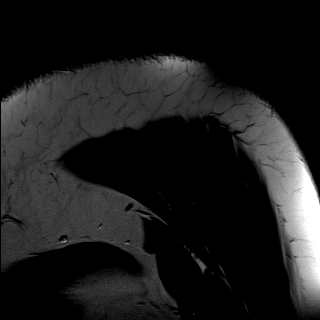
[im 4/25]
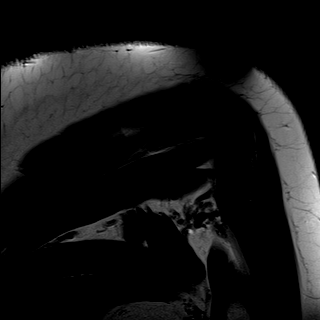
[im 7/25]
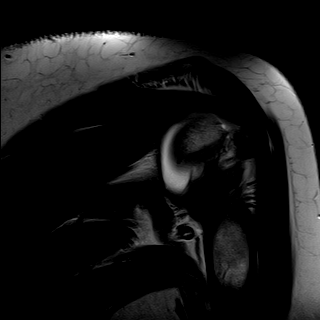
[im 11/25]
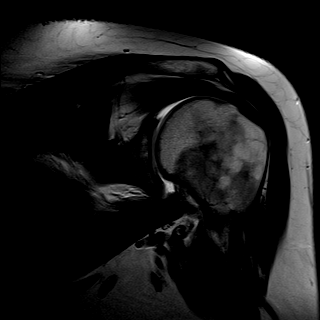
[im 14/25]
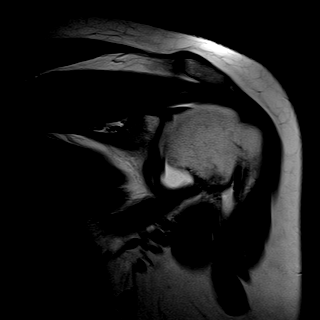
[im 18/25]
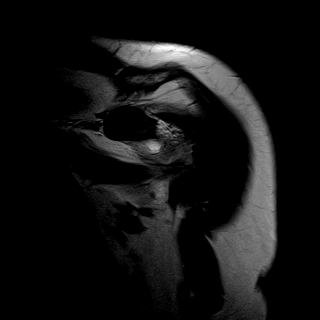
[im 21/25]
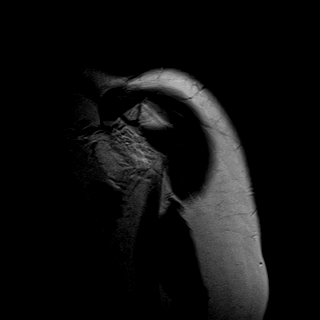
[im 25/25]
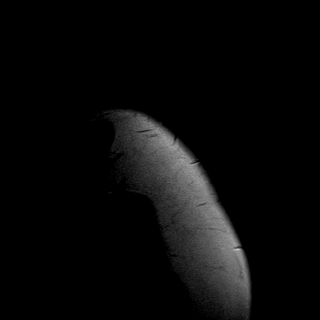

[Series 7: T2 fat-sat · oblique · 4.0mm · 0.55mm/px · 8 of 23 slices shown (2 of 2)]
[im 1/23]
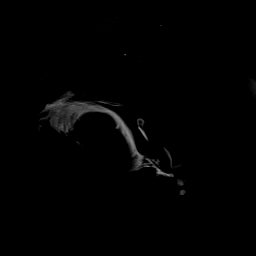
[im 4/23]
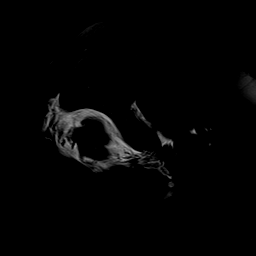
[im 7/23]
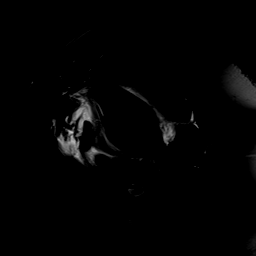
[im 10/23]
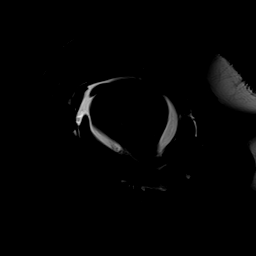
[im 13/23]
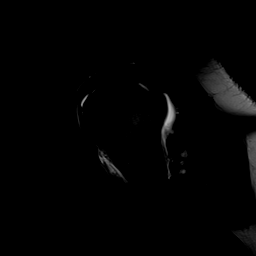
[im 16/23]
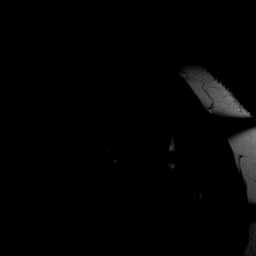
[im 19/23]
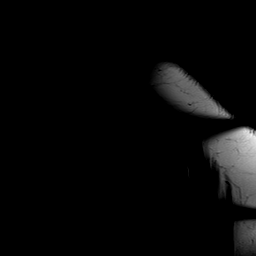
[im 23/23]
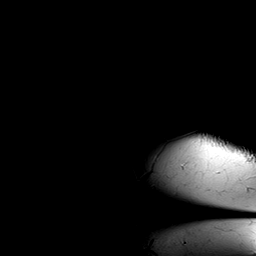

[40 of 40 positions shown; findings below may reference images not displayed]

FINDINGS: Slight extravasation of intra-articular contrast, tracking along the
subscapularis is noted.

Rotator cuff: Intact

Muscles: No intramuscular mass, hemorrhage or atrophy.

Biceps long head: Intact biceps anchor, horizontal and vertical
portions of the proximal biceps. The biceps tendon is seated within
its bicipital groove.

Acromioclavicular Joint: Mild AC joint osteoarthritis with
undersurface spurring. Slight lateral downsloping of the acromion
with lateral acromial spur. Type 1 acromial shape. Trace subacromial
and subdeltoid bursal edema.

Glenohumeral Joint: Congruent without focal chondral defects.

Labrum: Blunting of the anterior glenoid labrum at the 3 o'clock
position suspicious for soft tissue Bankart lesion.

Bones: Hill-Sachs deformity of the humeral head, series no bony
Bankart lesion is identified.
IMPRESSION: 1. Hill-Sachs deformity of the superolateral humeral head with soft
tissue Wenzi.
2. Intact biceps labral complex.  No labral tear identified.
3. Trace subacromial and subdeltoid bursitis.

## 2018-12-28 LAB — COMPLETE METABOLIC PANEL WITH GFR
AG Ratio: 1.7 (calc) (ref 1.0–2.5)
ALT: 29 U/L (ref 6–29)
AST: 19 U/L (ref 10–30)
Albumin: 4.2 g/dL (ref 3.6–5.1)
Alkaline phosphatase (APISO): 81 U/L (ref 31–125)
BUN: 15 mg/dL (ref 7–25)
CO2: 22 mmol/L (ref 20–32)
Calcium: 8.8 mg/dL (ref 8.6–10.2)
Chloride: 108 mmol/L (ref 98–110)
Creat: 0.78 mg/dL (ref 0.50–1.10)
GFR, Est African American: 113 mL/min/{1.73_m2} (ref >=60)
GFR, Est Non African American: 98 mL/min/{1.73_m2} (ref >=60)
Globulin: 2.5 g/dL (calc) (ref 1.9–3.7)
Glucose, Bld: 92 mg/dL (ref 65–99)
Potassium: 4 mmol/L (ref 3.5–5.3)
Sodium: 138 mmol/L (ref 135–146)
Total Bilirubin: 0.3 mg/dL (ref 0.2–1.2)
Total Protein: 6.7 g/dL (ref 6.1–8.1)

## 2018-12-28 LAB — CBC
HCT: 42.5 % (ref 35.0–45.0)
Hemoglobin: 14.2 g/dL (ref 11.7–15.5)
MCH: 29 pg (ref 27.0–33.0)
MCHC: 33.4 g/dL (ref 32.0–36.0)
MCV: 86.7 fL (ref 80.0–100.0)
MPV: 9.9 fL (ref 7.5–12.5)
Platelets: 187 10*3/uL (ref 140–400)
RBC: 4.9 10*6/uL (ref 3.80–5.10)
RDW: 12 % (ref 11.0–15.0)
WBC: 5.7 10*3/uL (ref 3.8–10.8)

## 2018-12-28 LAB — LIPID PANEL W/REFLEX DIRECT LDL
Cholesterol: 193 mg/dL (ref <200)
HDL: 60 mg/dL (ref >=50)
LDL Cholesterol (Calc): 115 mg/dL (calc) — ABNORMAL HIGH
Non-HDL Cholesterol (Calc): 133 mg/dL (calc) — ABNORMAL HIGH (ref <130)
Total CHOL/HDL Ratio: 3.2 (calc) (ref <5.0)
Triglycerides: 84 mg/dL (ref <150)

## 2018-12-28 LAB — HEMOGLOBIN A1C
Hgb A1c MFr Bld: 4.8 % of total Hgb (ref <5.7)
Mean Plasma Glucose: 91 (calc)
eAG (mmol/L): 5 (calc)

## 2018-12-28 LAB — TSH+FREE T4: TSH W/REFLEX TO FT4: 2.13 mIU/L

## 2018-12-28 LAB — VITAMIN D 25 HYDROXY (VIT D DEFICIENCY, FRACTURES): Vit D, 25-Hydroxy: 45 ng/mL (ref 30–100)

## 2018-12-29 ENCOUNTER — Other Ambulatory Visit: Payer: Self-pay

## 2018-12-29 ENCOUNTER — Ambulatory Visit (INDEPENDENT_AMBULATORY_CARE_PROVIDER_SITE_OTHER): Payer: Medicaid Other | Admitting: Licensed Clinical Social Worker

## 2018-12-29 ENCOUNTER — Encounter (HOSPITAL_COMMUNITY): Payer: Self-pay | Admitting: Licensed Clinical Social Worker

## 2018-12-29 DIAGNOSIS — F418 Other specified anxiety disorders: Secondary | ICD-10-CM | POA: Diagnosis not present

## 2018-12-29 NOTE — Progress Notes (Signed)
Virtual Visit via Video Note  I connected with Gwendolyn Bowen on 12/29/18 at 10:00 AM EDT by a video enabled telemedicine application and verified that I am speaking with the correct person using two identifiers.   I discussed the limitations of evaluation and management by telemedicine and the availability of in person appointments. The patient expressed understanding and agreed to proceed.  History of Present Illness:  Pt was referred to therapy by psychiatrist for depression and anxiety.    Observations/Objective: Reduce irritability. Pt presents depressed via webex therapy session.    Assessment and Plan: Pt presented  depressed and anxious during the webex session.  She discussed her psychiatric symptoms and current life events. "I'm frustrated from studying." Coached patient on study habits. Used CBT strategies to assist patient questioning fearful thoughts, slowly trying out new or different ways to study, and using your senses to ground herself in the present. Discussed the benefits of mindfulness.    Follow Up Instructions:   I discussed the assessment and treatment plan with the patient. The patient was provided an opportunity to ask questions and all were answered. The patient agreed with the plan and demonstrated an understanding of the instructions.   The patient was advised to call back or seek an in-person evaluation if the symptoms worsen or if the condition fails to improve as anticipated.  I provided 60 minutes of non-face-to-face time during this encounter.   Jalesha Plotz S, LCAS

## 2019-01-03 ENCOUNTER — Other Ambulatory Visit: Payer: Self-pay

## 2019-01-03 ENCOUNTER — Ambulatory Visit (INDEPENDENT_AMBULATORY_CARE_PROVIDER_SITE_OTHER): Payer: Medicaid Other | Admitting: Licensed Clinical Social Worker

## 2019-01-03 ENCOUNTER — Encounter (HOSPITAL_COMMUNITY): Payer: Self-pay | Admitting: Licensed Clinical Social Worker

## 2019-01-03 DIAGNOSIS — F418 Other specified anxiety disorders: Secondary | ICD-10-CM | POA: Diagnosis not present

## 2019-01-03 NOTE — Progress Notes (Signed)
Virtual Visit via Video Note  I connected with Gwendolyn Bowen on 01/03/19 at 10:00 AM EDT by a video enabled telemedicine application and verified that I am speaking with the correct person using two identifiers.   I discussed the limitations of evaluation and management by telemedicine and the availability of in person appointments. The patient expressed understanding and agreed to proceed.  History of Present Illness:  Pt was referred to therapy by psychiatrist for depression and anxiety.    Observations/Objective: Reduce irritability. Pt presents depressed via webex therapy session.    Assessment and Plan: Pt presented  depressed and anxious during the webex session.  She discussed her psychiatric symptoms and current life events. Pt is currently focused on her weight. "I'm not losing any weight, in fact I'm gaining weight. I've taken myself off 1 of my migraine meds and lost 3 pounds last week." Suggested to pt to contact her dr to let them know she has taken herself off her medication. Asked open ended questions about her weight. Educated pt on healthy weight management.  Coached pt on the importance of adherence to medication. Patient continues to study for her exam. Encouraged pt to use studying guidelines previously discussed. Coached pt on healthy eating, sleeping, studying habits leading up to exam. Pt was receptive to coaching.  Follow Up Instructions:   I discussed the assessment and treatment plan with the patient. The patient was provided an opportunity to ask questions and all were answered. The patient agreed with the plan and demonstrated an understanding of the instructions.   The patient was advised to call back or seek an in-person evaluation if the symptoms worsen or if the condition fails to improve as anticipated.  I provided 60 minutes of non-face-to-face time during this encounter.   Geniyah Eischeid S, LCAS

## 2019-01-07 ENCOUNTER — Telehealth: Payer: Self-pay | Admitting: Physician Assistant

## 2019-01-07 DIAGNOSIS — G43709 Chronic migraine without aura, not intractable, without status migrainosus: Secondary | ICD-10-CM

## 2019-01-07 DIAGNOSIS — IMO0002 Reserved for concepts with insufficient information to code with codable children: Secondary | ICD-10-CM

## 2019-01-07 DIAGNOSIS — R635 Abnormal weight gain: Secondary | ICD-10-CM

## 2019-01-07 MED ORDER — TOPIRAMATE 100 MG PO TABS: 100.0000 mg | ORAL_TABLET | Freq: Two times a day (BID) | ORAL | 3 refills | Status: DC

## 2019-01-07 NOTE — Telephone Encounter (Signed)
I called the pharmacy and per [place] the insurance paid for the patients medication. She was only able to get a 30 day supply.

## 2019-01-07 NOTE — Telephone Encounter (Signed)
Trokendi XR was denied by insurance because it is a non-preferred drug  New Rx send for Topiramate 100 mg twice a day Please let patient know that his must be taken twice a day since it is not extended release  Counsel patient to contact provider if experiencing any adverse effects tiredness, drowsiness, dizziness, nervousness, numbness or tingly feeling in the hands or feet, coordination problems, diarrhea, speech/language problems, changes in vision, sensory distortion, bad taste in your mouth, confusion, slowed thinking, trouble concentrating or paying attention, memory problems, and cold symptoms such as stuffy nose, sneezing, or sore throat

## 2019-01-07 NOTE — Telephone Encounter (Signed)
See other note this was approved for a 30 day supply and the pharmacy has filled this medication for the patient.

## 2019-01-10 ENCOUNTER — Telehealth (HOSPITAL_COMMUNITY): Payer: Self-pay | Admitting: Licensed Clinical Social Worker

## 2019-01-10 ENCOUNTER — Ambulatory Visit (HOSPITAL_COMMUNITY): Payer: Medicaid Other | Admitting: Licensed Clinical Social Worker

## 2019-01-10 NOTE — Telephone Encounter (Signed)
t did not present for her webex appointment. Sent webex reminder email. Also called pt and phone was "not accepting" calls.  lisbeth mackenzie, lcas

## 2019-01-17 ENCOUNTER — Ambulatory Visit (HOSPITAL_COMMUNITY): Payer: Medicaid Other | Admitting: Licensed Clinical Social Worker

## 2019-01-17 ENCOUNTER — Other Ambulatory Visit: Payer: Self-pay

## 2019-01-18 ENCOUNTER — Telehealth (HOSPITAL_COMMUNITY): Payer: Self-pay | Admitting: Licensed Clinical Social Worker

## 2019-01-18 NOTE — Telephone Encounter (Signed)
Pt emailed saying she has missed her last 2 appointments due to her phone being cut off. She requested not to be d/c. Emailed pt letting her know to contact me when her service is returned. Madissen Wyse, LCAS

## 2019-01-28 ENCOUNTER — Ambulatory Visit: Payer: Medicaid Other | Admitting: Physician Assistant

## 2019-02-03 ENCOUNTER — Other Ambulatory Visit: Payer: Self-pay | Admitting: Physician Assistant

## 2019-02-03 DIAGNOSIS — E6609 Other obesity due to excess calories: Secondary | ICD-10-CM

## 2019-02-03 DIAGNOSIS — R635 Abnormal weight gain: Secondary | ICD-10-CM

## 2019-02-07 ENCOUNTER — Encounter: Payer: Self-pay | Admitting: Physician Assistant

## 2019-02-07 ENCOUNTER — Ambulatory Visit (INDEPENDENT_AMBULATORY_CARE_PROVIDER_SITE_OTHER): Payer: Medicaid Other | Admitting: Physician Assistant

## 2019-02-07 ENCOUNTER — Other Ambulatory Visit: Payer: Self-pay

## 2019-02-07 VITALS — BP 103/64 | HR 88 | Temp 97.8°F | Wt 163.0 lb

## 2019-02-07 DIAGNOSIS — F331 Major depressive disorder, recurrent, moderate: Secondary | ICD-10-CM

## 2019-02-07 DIAGNOSIS — F4312 Post-traumatic stress disorder, chronic: Secondary | ICD-10-CM

## 2019-02-07 DIAGNOSIS — G43709 Chronic migraine without aura, not intractable, without status migrainosus: Secondary | ICD-10-CM | POA: Diagnosis not present

## 2019-02-07 DIAGNOSIS — R635 Abnormal weight gain: Secondary | ICD-10-CM

## 2019-02-07 DIAGNOSIS — E6609 Other obesity due to excess calories: Secondary | ICD-10-CM

## 2019-02-07 DIAGNOSIS — IMO0002 Reserved for concepts with insufficient information to code with codable children: Secondary | ICD-10-CM

## 2019-02-07 MED ORDER — LOMAIRA 8 MG PO TABS: 8.0000 mg | ORAL_TABLET | Freq: Every day | ORAL | 0 refills | Status: DC

## 2019-02-07 MED ORDER — CLOMIPRAMINE HCL 75 MG PO CAPS: 225.0000 mg | ORAL_CAPSULE | Freq: Every day | ORAL | 0 refills | Status: DC
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
## 2019-02-07 NOTE — Patient Instructions (Addendum)
Call Headache Clinic regarding appointment time for today! 1. Take one day, one minute at a time. If all you do is get up out of the bed and brush your teeth, that is a step! You will get through this 2. Ask Beth about recommendations for EMDR certified counselors. You can also look them up on Psychologytoday.com 3. Purchase the book The Body Keeps the Score 4. I want you to go outside for 5 minutes a day and just take a deep breathe. Gradually add walking 5. Contact below Psychiatrists about accepting new Medicaid patients Dr. Cranston Neighbor [phone] Dr. Chucky May [phone] If not, contact Dr. Daron Offer for referral

## 2019-02-08 ENCOUNTER — Encounter: Payer: Self-pay | Admitting: Physician Assistant

## 2019-02-21 ENCOUNTER — Ambulatory Visit: Payer: Medicaid Other | Admitting: Physician Assistant

## 2019-03-01 ENCOUNTER — Ambulatory Visit: Payer: Medicaid Other | Admitting: Physician Assistant

## 2019-03-03 ENCOUNTER — Other Ambulatory Visit: Payer: Self-pay

## 2019-03-03 ENCOUNTER — Ambulatory Visit (INDEPENDENT_AMBULATORY_CARE_PROVIDER_SITE_OTHER): Payer: Medicaid Other | Admitting: Physician Assistant

## 2019-03-03 ENCOUNTER — Encounter: Payer: Self-pay | Admitting: Physician Assistant

## 2019-03-03 VITALS — BP 94/64 | HR 94 | Temp 98.4°F | Wt 162.0 lb

## 2019-03-03 DIAGNOSIS — F4312 Post-traumatic stress disorder, chronic: Secondary | ICD-10-CM

## 2019-03-03 DIAGNOSIS — F331 Major depressive disorder, recurrent, moderate: Secondary | ICD-10-CM

## 2019-03-03 DIAGNOSIS — F39 Unspecified mood [affective] disorder: Secondary | ICD-10-CM
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
## 2019-03-23 MED ORDER — CLOMIPRAMINE HCL 75 MG PO CAPS: 150.0000 mg | ORAL_CAPSULE | Freq: Every day | ORAL | 0 refills | Status: DC

## 2019-04-13 ENCOUNTER — Ambulatory Visit (INDEPENDENT_AMBULATORY_CARE_PROVIDER_SITE_OTHER): Payer: Medicaid Other | Admitting: Physician Assistant

## 2019-04-13 ENCOUNTER — Other Ambulatory Visit: Payer: Self-pay

## 2019-04-13 VITALS — BP 126/74 | HR 98 | Ht 63.0 in | Wt 158.0 lb

## 2019-04-13 DIAGNOSIS — F4312 Post-traumatic stress disorder, chronic: Secondary | ICD-10-CM

## 2019-04-13 DIAGNOSIS — F411 Generalized anxiety disorder: Secondary | ICD-10-CM | POA: Diagnosis not present

## 2019-04-13 DIAGNOSIS — IMO0002 Reserved for concepts with insufficient information to code with codable children: Secondary | ICD-10-CM

## 2019-04-13 DIAGNOSIS — E6609 Other obesity due to excess calories: Secondary | ICD-10-CM | POA: Diagnosis not present

## 2019-04-13 DIAGNOSIS — F99 Mental disorder, not otherwise specified: Secondary | ICD-10-CM

## 2019-04-13 DIAGNOSIS — F5105 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 disorder: Secondary | ICD-10-CM

## 2019-04-13 DIAGNOSIS — G43709 Chronic migraine without aura, not intractable, without status migrainosus: Secondary | ICD-10-CM

## 2019-04-13 DIAGNOSIS — R635 Abnormal weight gain: Secondary | ICD-10-CM

## 2019-04-13 MED ORDER — BELSOMRA 10 MG PO TABS: 1.0000 | ORAL_TABLET | Freq: Every day | ORAL | 2 refills | Status: DC

## 2019-04-13 MED ORDER — CLONAZEPAM 0.5 MG PO TABS: 0.5000 mg | ORAL_TABLET | Freq: Two times a day (BID) | ORAL | 1 refills | Status: DC | PRN

## 2019-04-13 MED ORDER — LOMAIRA 8 MG PO TABS: 8.0000 mg | ORAL_TABLET | Freq: Every day | ORAL | 0 refills | Status: DC

## 2019-04-13 MED ORDER — BUSPIRONE HCL 7.5 MG PO TABS: 7.5000 mg | ORAL_TABLET | Freq: Two times a day (BID) | ORAL | 2 refills | Status: DC
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
## 2019-04-19 ENCOUNTER — Encounter: Payer: Self-pay | Admitting: Physician Assistant

## 2019-04-20 ENCOUNTER — Telehealth: Payer: Self-pay | Admitting: Physician Assistant

## 2019-04-20 ENCOUNTER — Ambulatory Visit (INDEPENDENT_AMBULATORY_CARE_PROVIDER_SITE_OTHER): Payer: Medicaid Other | Admitting: Sports Medicine

## 2019-04-20 ENCOUNTER — Other Ambulatory Visit: Payer: Self-pay

## 2019-04-20 DIAGNOSIS — S43015S Anterior dislocation of left humerus, sequela: Secondary | ICD-10-CM
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
## 2019-04-20 NOTE — Assessment & Plan Note (Signed)
History of a shoulder dislocation last year, she did well after conservative measures, formal physical therapy, she did have a fairly large Hill-Sachs lesion but no rotator cuff tearing. At the last visit in January of this year she had symmetric range of motion, a negative apprehension sign and no translational instability. Over the past 4 months she has developed increasing pain over the deltoid, worse with overhead activities, waking her from sleep. Subacromial injection today, formal therapy, return to see me in 4 weeks.

## 2019-04-20 NOTE — Telephone Encounter (Signed)
Patient had a question regarding her new anxiety medication that was prescribed to her. She didn't know the exact name since she stated that her medications recently got all switched around, but she was wondering if her anxiety medication covers depression as well and would like a call back regarding this. Also said if she didn't answer, it is okay to leave a detailed message on her cell phone. Please Advise.

## 2019-04-21 NOTE — Telephone Encounter (Signed)
buspar that was given is for anxiety only.  Belsomra is for sleep.  You have your Tofranil that you take at time that has some mood benefits.

## 2019-04-21 NOTE — Telephone Encounter (Signed)
Spoke with patient and verified medications. All questions were answered.

## 2019-04-27 ENCOUNTER — Other Ambulatory Visit: Payer: Self-pay

## 2019-04-27 ENCOUNTER — Encounter: Payer: Self-pay | Admitting: Physical Therapy

## 2019-04-27 ENCOUNTER — Telehealth: Payer: Self-pay | Admitting: Physician Assistant

## 2019-04-27 ENCOUNTER — Ambulatory Visit: Payer: Medicaid Other | Attending: Sports Medicine | Admitting: Physical Therapy

## 2019-04-27 DIAGNOSIS — M25612 Stiffness of left shoulder, not elsewhere classified: Secondary | ICD-10-CM | POA: Diagnosis present

## 2019-04-27 DIAGNOSIS — M25512 Pain in left shoulder: Secondary | ICD-10-CM | POA: Diagnosis not present

## 2019-04-27 DIAGNOSIS — G8929 Other chronic pain: Secondary | ICD-10-CM | POA: Diagnosis present

## 2019-04-27 DIAGNOSIS — M542 Cervicalgia: Secondary | ICD-10-CM

## 2019-04-27 DIAGNOSIS — R293 Abnormal posture: Secondary | ICD-10-CM
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
## 2019-04-27 NOTE — Telephone Encounter (Signed)
Received fax for Prior auth on [place] called [place] I had to print and fax authorization form for Belsomra and medicaid does not cover weight loss meds. Waiting for determination on Belsomra - CF

## 2019-05-03 ENCOUNTER — Other Ambulatory Visit: Payer: Self-pay | Admitting: Neurology

## 2019-05-03 MED ORDER — PHENTERMINE HCL 15 MG PO CAPS: 15.0000 mg | ORAL_CAPSULE | ORAL | 1 refills | Status: DC

## 2019-05-03 NOTE — Telephone Encounter (Signed)
Patient made aware Latanya Presser not covered by insurance [phone] E66.09/ Medicaid doesn't cover weight loss medications). Per Luvenia Starch okay to send Phentermine 15 mg if patient agreeable. This should be $20-25 without insurance. Patient agreeable to try. Please sign RX.

## 2019-05-04 ENCOUNTER — Ambulatory Visit: Payer: Medicaid Other | Admitting: Physical Therapy

## 2019-05-04 ENCOUNTER — Other Ambulatory Visit: Payer: Self-pay

## 2019-05-04 ENCOUNTER — Encounter: Payer: Self-pay | Admitting: Physical Therapy

## 2019-05-04 DIAGNOSIS — G8929 Other chronic pain: Secondary | ICD-10-CM

## 2019-05-04 DIAGNOSIS — M542 Cervicalgia: Secondary | ICD-10-CM

## 2019-05-04 DIAGNOSIS — R293 Abnormal posture: Secondary | ICD-10-CM

## 2019-05-04 DIAGNOSIS — M25612 Stiffness of left shoulder, not elsewhere classified: Secondary | ICD-10-CM

## 2019-05-04 DIAGNOSIS — M25512 Pain in left shoulder: Secondary | ICD-10-CM | POA: Diagnosis not present
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
## 2019-05-11 ENCOUNTER — Ambulatory Visit: Payer: Medicaid Other

## 2019-05-17 ENCOUNTER — Ambulatory Visit: Payer: Medicaid Other | Admitting: Physical Therapy

## 2019-05-18 ENCOUNTER — Encounter: Payer: Self-pay | Admitting: Sports Medicine

## 2019-05-18 ENCOUNTER — Ambulatory Visit (INDEPENDENT_AMBULATORY_CARE_PROVIDER_SITE_OTHER): Payer: Medicaid Other | Admitting: Sports Medicine

## 2019-05-18 DIAGNOSIS — S43015S Anterior dislocation of left humerus, sequela: Secondary | ICD-10-CM
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
## 2019-05-18 NOTE — Assessment & Plan Note (Signed)
Doing much better, subacromial injection and physical therapy at the last visit.

## 2019-05-23 ENCOUNTER — Ambulatory Visit: Payer: Medicaid Other | Admitting: Physical Therapy

## 2019-06-06 NOTE — Telephone Encounter (Signed)
Belsomra was denied patient can do an appeal provider is aware - CF

## 2019-07-15 ENCOUNTER — Ambulatory Visit (INDEPENDENT_AMBULATORY_CARE_PROVIDER_SITE_OTHER): Payer: Medicaid Other | Admitting: Physician Assistant

## 2019-07-15 DIAGNOSIS — Z5329 Procedure and treatment not carried out because of patient's decision for other reasons: Secondary | ICD-10-CM

## 2019-07-15 NOTE — Progress Notes (Signed)
No show

## 2019-08-02 ENCOUNTER — Other Ambulatory Visit: Payer: Self-pay

## 2019-08-02 ENCOUNTER — Ambulatory Visit (INDEPENDENT_AMBULATORY_CARE_PROVIDER_SITE_OTHER): Payer: Medicaid Other | Admitting: Physician Assistant

## 2019-08-02 VITALS — BP 116/80 | HR 110 | Ht 63.0 in | Wt 155.0 lb

## 2019-08-02 DIAGNOSIS — F4312 Post-traumatic stress disorder, chronic: Secondary | ICD-10-CM

## 2019-08-02 DIAGNOSIS — F329 Major depressive disorder, single episode, unspecified: Secondary | ICD-10-CM

## 2019-08-02 DIAGNOSIS — F411 Generalized anxiety disorder: Secondary | ICD-10-CM | POA: Diagnosis not present

## 2019-08-02 DIAGNOSIS — F419 Anxiety disorder, unspecified: Secondary | ICD-10-CM

## 2019-08-02 DIAGNOSIS — F99 Mental disorder, not otherwise specified: Secondary | ICD-10-CM

## 2019-08-02 DIAGNOSIS — F39 Unspecified mood [affective] disorder: Secondary | ICD-10-CM

## 2019-08-02 DIAGNOSIS — F331 Major depressive disorder, recurrent, moderate: Secondary | ICD-10-CM

## 2019-08-02 DIAGNOSIS — F5105 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 disorder: Secondary | ICD-10-CM

## 2019-08-02 MED ORDER — OLANZAPINE-FLUOXETINE HCL 3-25 MG PO CAPS: ORAL_CAPSULE | ORAL | 1 refills | Status: DC

## 2019-08-02 MED ORDER — BUSPIRONE HCL 10 MG PO TABS: 10.0000 mg | ORAL_TABLET | Freq: Two times a day (BID) | ORAL | 1 refills | Status: DC
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
## 2019-08-02 NOTE — Patient Instructions (Signed)
Bipolar 2 Disorder Bipolar 2 disorder is a mental health disorder in which a person has episodes of emotional highs and episodes of emotional lows, or depression. In bipolar 2 disorder, the episodes of emotional highs are less extreme and do not last as long as in bipolar 1 disorder. These highs are called hypomania. People with bipolar 2 disorder have had at least one episode of hypomania (hypomanic episode) in their lives, which is usually followed by a depressive episode. Some people may have cycles of hypomanic and depressive episodes. Some people with bipolar 2 disorder may lead a very normal life between episodes. What are the causes? The cause of this condition is not known. What increases the risk? The following factors may make you more likely to develop this condition:  Having a family member with the disorder.  Having an imbalance of certain chemicals in the brain (neurotransmitters).  Experiencing stress, such as illness, divorce, financial problems, or a death.  Having certain conditions that affect the brain or spinal cord (neurologic conditions).  Having had a brain injury (trauma). What are the signs or symptoms? Symptoms of hypomania include:  Very high self-esteem or self-confidence.  Decreased need for sleep.  Unusual talkativeness. Speech may be very fast.  Racing thoughts, with quick shifts between topics that may or may not be related (flight of ideas).  Change in ability to concentrate. Some people may have better focus, and others may not be able to focus at all.  Increased agitation. This could be pacing, squirming, fidgeting, or finger and toe tapping.  Impulsive behavior and poor judgment. This may result in high-risk activities, such as: ? Being sexual with people you normally wouldn't be sexual with. ? Spending money you have borrowed on things you don't need. Symptoms of depression include:  Extreme degrees of sadness, uncontrollable crying,  hopelessness, worthlessness, or numbness.  Sleep problems, such as insomnia, waking early, or sleeping too much.  No longer enjoying things you used to enjoy.  Isolation. You may often spend time alone.  Lack of energy or moving more slowly than normal.  Trouble making decisions.  Changes in appetite, such as eating too much or not eating.  Thoughts of death, or wanting to harm yourself. Sometimes, you may have a mix of symptoms of hypomania and depression at the same time. Stress can often trigger these symptoms. How is this diagnosed? This condition may be diagnosed based on:  Emotional episodes.  Medical history.  Use of alcohol, drugs, and prescription medicines. Certain medical conditions and substances can cause symptoms that seem like bipolar disorder. This is called secondary bipolar disorder. Your health care provider may ask you to take a short test. This helps to understand your symptoms. You may also be asked to see a mental health specialist for further evaluation or to start treatment. How is this treated?     This condition is a long-term (chronic) illness. It is often managed with ongoing treatment rather than treatment only when symptoms occur. A combination of treatments is the main approach. Treatment may include:  Psychotherapy. Some forms of talk therapy, such as cognitive behavioral therapy (CBT) and family therapy, can help with learning to manage bipolar disorder.  Psychoeducation. This helps you and others understand how this disorder is managed. Include friends and family in educational sessions so they learn how best to support you.  Methods of managing your condition, such as journaling or relaxation exercises. Relaxation exercises include: ? Yoga. ? Meditation. ? Deep breathing.    Lifestyle changes, such as: ? Limiting alcohol and drug use. ? Exercising regularly. ? Structuring when you go to bed and when you get up. ? Eating a healthy  diet.  Medicine. Medicine can be prescribed by a health care provider who specializes in treating mental health disorders (psychiatrist). Medicines called mood stabilizers are usually prescribed. If symptoms occur during treatment with a mood stabilizer, other medicines may be added. Follow these instructions at home: Activity  Return to your normal activities as told by your health care provider.  Find activities that you enjoy, and make time to do them.  Exercise regularly as told by your health care provider. Lifestyle   Follow a set daily schedule.  Eat a healthy diet that includes fresh fruits and vegetables, whole grains, low-fat dairy, and lean meat.  Get at least 7-8 hours of sleep each night.  Avoid using products that contain nicotine or tobacco. If you want help quitting, ask your health care provider.  Do not use drugs. Alcohol use  Do not drink alcohol if: ? Your health care provider tells you not to drink. ? You are pregnant, may be pregnant, or are planning to become pregnant.  If you drink alcohol: ? Limit how much you use to:  0-1 drink a day for women.  0-2 drinks a day for men. ? Be aware of how much alcohol is in your drink. In the [place] one drink equals one 12 oz bottle of beer (355 mL), one 5 oz glass of wine (148 mL), or one 1 oz glass of hard liquor (44 mL). General instructions  Take over-the-counter and prescription medicines only as told by your health care provider. You may think about stopping your medicine, but it is very important to take your medicine as prescribed.  Consider joining a support group. Your health care provider may be able to recommend one.  Talk with your family and friends about your treatment goals and how they can help.  Keep all follow-up visits as told by your health care provider. This is important. Where to find more information  National Alliance on Mental Illness: www.nami.org  National Institute of Mental  Health: www.nimh.nih.gov Contact a health care provider if:  Your symptoms get worse, or your loved ones tell you that your symptoms are getting worse.  You have uncomfortable side effects from your medicine.  You have trouble sleeping.  You have trouble doing daily activities.  You feel unsafe in your surroundings.  You are self-medicating with alcohol or drugs. Get help right away if:  You have new symptoms.  You have thoughts about harming yourself or others.  You are considering suicide. If you ever feel like you may hurt yourself or others, or have thoughts about taking your own life, get help right away. You can go to your nearest emergency department or call:  Your local emergency services (911 in the [place]  A suicide crisis helpline, such as the National Suicide Prevention Lifeline at 1-800-273-8255. This is open 24 hours a day. Summary  Bipolar 2 disorder is a lifelong mental health disorder in which a person has episodes of hypomania and depression.  This disorder is mainly treated with a combination of talk therapy, education, strategies for managing the condition, and medicines.  Talk with your family and friends about your treatment goals and how they can help.  Get help right away if you are considering suicide. This information is not intended to replace advice given to you by your health   care provider. Make sure you discuss any questions you have with your health care provider. Document Revised: 11/23/2018 Document Reviewed: 11/23/2018 Elsevier Patient Education  2020 Elsevier Inc.  

## 2019-08-03 ENCOUNTER — Encounter: Payer: Self-pay | Admitting: Physician Assistant

## 2019-08-15 ENCOUNTER — Telehealth: Payer: Self-pay | Admitting: Neurology

## 2019-08-15 NOTE — Telephone Encounter (Signed)
Tried to call patient to make her aware, no answer, busy tone.

## 2019-08-15 NOTE — Telephone Encounter (Signed)
Patient left vm that she has not heard from counselor downstairs about scheduling appt.  She also states her Symbyax needs a prior authorization and has not heard about that either.

## 2019-08-15 NOTE — Telephone Encounter (Signed)
Gwendolyn Bowen didn't send her downstairs her referral went to [facility].  She can call [phone] and they will schedule her.  As far as her medicine I sent the form to Medicaid I will call and check on it. - CF

## 2019-08-16 NOTE — Telephone Encounter (Signed)
Called patient, she is calling about referral and she understands that we are waiting on PA for medication

## 2019-08-30 ENCOUNTER — Ambulatory Visit (INDEPENDENT_AMBULATORY_CARE_PROVIDER_SITE_OTHER): Payer: Medicaid Other | Admitting: Physician Assistant

## 2019-08-30 VITALS — BP 109/58 | HR 78 | Ht 63.0 in | Wt 156.0 lb

## 2019-08-30 DIAGNOSIS — F411 Generalized anxiety disorder: Secondary | ICD-10-CM

## 2019-08-30 DIAGNOSIS — F4312 Post-traumatic stress disorder, chronic: Secondary | ICD-10-CM

## 2019-08-30 DIAGNOSIS — F39 Unspecified mood [affective] disorder: Secondary | ICD-10-CM | POA: Diagnosis not present

## 2019-08-30 MED ORDER — CLONAZEPAM 0.5 MG PO TABS: 0.5000 mg | ORAL_TABLET | Freq: Two times a day (BID) | ORAL | 1 refills | Status: DC | PRN

## 2019-08-30 MED ORDER — LAMOTRIGINE 25 MG PO TABS: 25.0000 mg | ORAL_TABLET | Freq: Every day | ORAL | 1 refills | Status: DC

## 2019-08-30 MED ORDER — FLUOXETINE HCL 20 MG PO CAPS: 20.0000 mg | ORAL_CAPSULE | Freq: Every day | ORAL | 1 refills | Status: DC
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
## 2019-08-31 ENCOUNTER — Ambulatory Visit: Payer: Medicaid Other | Admitting: Sports Medicine

## 2019-08-31 ENCOUNTER — Encounter: Payer: Self-pay | Admitting: Physician Assistant

## 2019-09-01 ENCOUNTER — Ambulatory Visit (INDEPENDENT_AMBULATORY_CARE_PROVIDER_SITE_OTHER): Payer: Managed Care, Other (non HMO)

## 2019-09-01 ENCOUNTER — Ambulatory Visit (INDEPENDENT_AMBULATORY_CARE_PROVIDER_SITE_OTHER): Payer: Managed Care, Other (non HMO) | Admitting: Sports Medicine

## 2019-09-01 ENCOUNTER — Other Ambulatory Visit: Payer: Self-pay

## 2019-09-01 DIAGNOSIS — S43015S Anterior dislocation of left humerus, sequela: Secondary | ICD-10-CM

## 2019-09-01 NOTE — Assessment & Plan Note (Signed)
Gwendolyn Bowen returns, a couple of years ago she had an anterior dislocation of her shoulder. Ultimately this healed well, she had no residual symptoms, imaging at the time did show a Hill-Sachs lesion. More recently she was lifting boxes at work, and lifted a heavy box and felt a severe pain in her left shoulder, now she has impingement signs and a weak supraspinatus. I am concerned that she tore her rotator cuff, positive Hawkins sign, positive Neer sign, positive empty can sign. No translational instability to suggest capsular or labral recurrent injury. We are going to proceed with x-ray and MRI today, out of work until Monday. Hopefully we can get the MRI done this weekend.

## 2019-09-01 NOTE — Progress Notes (Signed)
    Procedures performed today:    None.  Independent interpretation of notes and tests performed by another provider:   I did personally review her old MRI that shows Hill-Sachs lesion, no rotator cuff tearing.  Impression and Recommendations:    Anterior dislocation of left shoulder Gwendolyn Bowen returns, a couple of years ago she had an anterior dislocation of her shoulder. Ultimately this healed well, she had no residual symptoms, imaging at the time did show a Hill-Sachs lesion. More recently she was lifting boxes at work, and lifted a heavy box and felt a severe pain in her left shoulder, now she has impingement signs and a weak supraspinatus. I am concerned that she tore her rotator cuff, positive Hawkins sign, positive Neer sign, positive empty can sign. No translational instability to suggest capsular or labral recurrent injury. We are going to proceed with x-ray and MRI today, out of work until Monday. Hopefully we can get the MRI done this weekend.    ___________________________________________ Ihor Austin. Benjamin Stain, M.D., ABFM., CAQSM. Primary Care and Sports Medicine [facility]  Adjunct Instructor of Family Medicine  [employer]

## 2019-09-06 ENCOUNTER — Telehealth: Payer: Self-pay | Admitting: Physician Assistant

## 2019-09-06 NOTE — Telephone Encounter (Signed)
PT wants to return back to work but hasn't had her MRI yet.  She stated, " Can he put me on light duty until we know the results. I really need to be back."  Please advise.

## 2019-09-06 NOTE — Telephone Encounter (Signed)
Light duty work note written

## 2019-09-10 ENCOUNTER — Other Ambulatory Visit: Payer: Self-pay

## 2019-09-10 ENCOUNTER — Ambulatory Visit (INDEPENDENT_AMBULATORY_CARE_PROVIDER_SITE_OTHER): Payer: Managed Care, Other (non HMO)

## 2019-09-10 DIAGNOSIS — S43015S Anterior dislocation of left humerus, sequela: Secondary | ICD-10-CM

## 2019-09-13 ENCOUNTER — Other Ambulatory Visit: Payer: Self-pay

## 2019-09-13 ENCOUNTER — Ambulatory Visit (INDEPENDENT_AMBULATORY_CARE_PROVIDER_SITE_OTHER): Payer: Managed Care, Other (non HMO) | Admitting: Sports Medicine

## 2019-09-13 DIAGNOSIS — S43015S Anterior dislocation of left humerus, sequela: Secondary | ICD-10-CM | POA: Diagnosis not present

## 2019-09-13 NOTE — Assessment & Plan Note (Signed)
This is a pleasant 39-year female, she has a history of anterior shoulder dislocation, this healed well, she had no residual symptoms, imaging at the time did show a Hill-Sachs lesion. More recently was putting boxes up at work, felt a severe pain in her left shoulder. As I was concerned for internal derangement we obtained an MRI that showed a new labral tear. She is back today and we performed a glenohumeral injection, return to see me in 1 month, she will do some rehab exercises at home, and if no better I would like her to touch base with Dr. Everardo Pacific.

## 2019-09-13 NOTE — Progress Notes (Signed)
    Procedures performed today:    Procedure: Real-time Ultrasound Guided injection of the left glenohumeral joint Device: Samsung HS60  Verbal informed consent obtained.  Time-out conducted.  Noted no overlying erythema, induration, or other signs of local infection.  Skin prepped in a sterile fashion.  Local anesthesia: Topical Ethyl chloride.  With sterile technique and under real time ultrasound guidance: 1 cc Kenalog 40, 2 cc lidocaine, 2 cc bupivacaine injected easily Completed without difficulty  Pain immediately resolved suggesting accurate placement of the medication.  Advised to call if fevers/chills, erythema, induration, drainage, or persistent bleeding.  Images permanently stored and available for review in the ultrasound unit.  Impression: Technically successful ultrasound guided injection.  Independent interpretation of notes and tests performed by another provider:   None.  Impression and Recommendations:    Anterior dislocation of left shoulder This is a pleasant 39-year female, she has a history of anterior shoulder dislocation, this healed well, she had no residual symptoms, imaging at the time did show a Hill-Sachs lesion. More recently was putting boxes up at work, felt a severe pain in her left shoulder. As I was concerned for internal derangement we obtained an MRI that showed a new labral tear. She is back today and we performed a glenohumeral injection, return to see me in 1 month, she will do some rehab exercises at home, and if no better I would like her to touch base with Dr. Everardo Pacific.    ___________________________________________ Ihor Austin. Benjamin Stain, M.D., ABFM., CAQSM. Primary Care and Sports Medicine [facility]  Adjunct Instructor of Family Medicine  [employer]

## 2019-10-11 ENCOUNTER — Other Ambulatory Visit: Payer: Self-pay

## 2019-10-11 ENCOUNTER — Encounter: Payer: Self-pay | Admitting: Sports Medicine

## 2019-10-11 ENCOUNTER — Ambulatory Visit (INDEPENDENT_AMBULATORY_CARE_PROVIDER_SITE_OTHER): Payer: Medicaid Other | Admitting: Sports Medicine

## 2019-10-11 DIAGNOSIS — S43015S Anterior dislocation of left humerus, sequela: Secondary | ICD-10-CM | POA: Diagnosis not present

## 2019-10-11 MED ORDER — HYDROCODONE-ACETAMINOPHEN 5-325 MG PO TABS: 1.0000 | ORAL_TABLET | Freq: Three times a day (TID) | ORAL | 0 refills | Status: DC | PRN

## 2019-10-11 NOTE — Progress Notes (Signed)
    Procedures performed today:    None.  Independent interpretation of notes and tests performed by another provider:   None.  Brief History, Exam, Impression, and Recommendations:    Anterior dislocation of left shoulder Gwendolyn Bowen, she has a history of an anterior dislocation that healed well, she had no residual symptoms with the exception of a Hill-Sachs lesion on imaging. More recently she was putting up boxes at work about a month ago, and felt a severe pain in her shoulder. She had some labral signs on exam and an MRI did show a new superior labral tear. We treated her conservatively with home rehabilitation exercises, glenohumeral injection and [name] today with persistent discomfort, I do think it is time for Dr. Everardo Pacific to weigh in for consideration of arthroscopic labral repair. Adding some hydrocodone to hold her over in the meantime.    ___________________________________________ Ihor Austin. Benjamin Stain, M.D., ABFM., CAQSM. Primary Care and Sports Medicine [facility]  Adjunct Instructor of Family Medicine  [employer]

## 2019-10-11 NOTE — Assessment & Plan Note (Signed)
Gwendolyn Bowen returns, she has a history of an anterior dislocation that healed well, she had no residual symptoms with the exception of a Hill-Sachs lesion on imaging. More recently she was putting up boxes at work about a month ago, and felt a severe pain in her shoulder. She had some labral signs on exam and an MRI did show a new superior labral tear. We treated her conservatively with home rehabilitation exercises, glenohumeral injection and she returns today with persistent discomfort, I do think it is time for Dr. Everardo Pacific to weigh in for consideration of arthroscopic labral repair. Adding some hydrocodone to hold her over in the meantime.

## 2019-11-02 ENCOUNTER — Other Ambulatory Visit (HOSPITAL_BASED_OUTPATIENT_CLINIC_OR_DEPARTMENT_OTHER): Payer: Self-pay | Admitting: Family Medicine

## 2019-11-30 ENCOUNTER — Other Ambulatory Visit: Payer: Self-pay | Admitting: Physician Assistant

## 2019-11-30 ENCOUNTER — Ambulatory Visit (INDEPENDENT_AMBULATORY_CARE_PROVIDER_SITE_OTHER): Payer: Medicaid Other | Admitting: Physician Assistant

## 2019-11-30 ENCOUNTER — Encounter: Payer: Self-pay | Admitting: Physician Assistant

## 2019-11-30 VITALS — BP 117/58 | HR 98 | Ht 63.0 in | Wt 156.0 lb

## 2019-11-30 DIAGNOSIS — R61 Generalized hyperhidrosis: Secondary | ICD-10-CM

## 2019-11-30 DIAGNOSIS — F4312 Post-traumatic stress disorder, chronic: Secondary | ICD-10-CM

## 2019-11-30 DIAGNOSIS — Z79899 Other long term (current) drug therapy: Secondary | ICD-10-CM | POA: Diagnosis not present

## 2019-11-30 DIAGNOSIS — F411 Generalized anxiety disorder: Secondary | ICD-10-CM | POA: Diagnosis not present

## 2019-11-30 DIAGNOSIS — F39 Unspecified mood [affective] disorder: Secondary | ICD-10-CM | POA: Diagnosis not present

## 2019-11-30 DIAGNOSIS — J0100 Acute maxillary sinusitis, unspecified: Secondary | ICD-10-CM

## 2019-11-30 DIAGNOSIS — F331 Major depressive disorder, recurrent, moderate: Secondary | ICD-10-CM

## 2019-11-30 MED ORDER — AZITHROMYCIN 250 MG PO TABS: ORAL_TABLET | ORAL | 0 refills | Status: DC

## 2019-11-30 MED ORDER — FLUOXETINE HCL 40 MG PO CAPS: 40.0000 mg | ORAL_CAPSULE | Freq: Every day | ORAL | 0 refills | Status: DC

## 2019-11-30 MED ORDER — LAMOTRIGINE 25 MG PO TABS: 25.0000 mg | ORAL_TABLET | Freq: Two times a day (BID) | ORAL | 2 refills | Status: DC

## 2019-11-30 NOTE — Progress Notes (Addendum)
Subjective:    Patient ID: Gwendolyn Bowen, female    DOB: [birth year], 39 y.o.   MRN: [id number]  HPI Gwendolyn Bowen is a 39 year old female with PMH of anxiety, depression, mood disorder, and PTSD presenting for medication follow up. Patient was started on Lamictal 3 months ago. She states she is tolerating it well, denies any side effects or new onset rashes. She states her mood and anxiety have improved some since starting the new medication.  Patient has not seen psychiatry, states she missed her scheduled appointment with them and they will not see her.   Patient states she has had sinus congestion, green nasal mucus, and a dry cough for 1 week. She has been taking musinex which has not helped. She denies fevers, chills.   She states she is concerned about menopausal symptoms. Admits to night sweats and difficulty losing weight. Patient had a hysterectomy - she is unsure if it was a total or partial.    .. Depression screen [facility] 2/9 11/30/2019 08/30/2019 08/02/2019 04/13/2019 03/03/2019  Decreased Interest 1 3 2 2 2   Down, Depressed, Hopeless 3 3 3 3 2   PHQ - 2 Score 4 6 5 5 4   Altered sleeping 3 3 3 3 2   Tired, decreased energy 2 3 3 3 2   Change in appetite 2 0 0 1 1  Feeling bad or failure about yourself  1 3 2 2 1   Trouble concentrating 0 1 2 1 1   Moving slowly or fidgety/restless 0 3 2 1 1   Suicidal thoughts 1 0 0 0 0  PHQ-9 Score 13 19 17 16 12   Difficult doing work/chores - Extremely dIfficult Very difficult Very difficult -  Some encounter information is confidential and restricted. Go to Review Flowsheets activity to see all data.  Some recent data might be hidden    . GAD 7 : Generalized Anxiety Score 11/30/2019 08/30/2019 08/02/2019 04/13/2019  Nervous, Anxious, on Edge 3 3 3 3   Control/stop worrying 3 3 3 2   Worry too much - different things 3 3 3 2   Trouble relaxing 2 0 2 1  Restless 1 0 1 1  Easily annoyed or irritable 3 3 3 1   Afraid - awful might happen 3 3 2 1   Total GAD  7 Score 18 15 17 11   Anxiety Difficulty - Extremely difficult Very difficult Very difficult  Some encounter information is confidential and restricted. Go to Review Flowsheets activity to see all data.       Review of Systems  Constitutional: Negative for chills and fever.  HENT: Positive for congestion, rhinorrhea, sinus pressure and sinus pain. Negative for ear pain and postnasal drip.   Eyes: Negative.   Respiratory: Negative.   Cardiovascular: Negative.   Gastrointestinal: Negative.   Skin: Negative.   Psychiatric/Behavioral: Negative.        Objective:   Physical Exam Constitutional:      Appearance: Normal appearance.  HENT:     Head: Normocephalic and atraumatic.     Right Ear: Tympanic membrane normal.     Left Ear: Tympanic membrane normal.     Nose: Congestion and rhinorrhea present.     Right Sinus: Maxillary sinus tenderness and frontal sinus tenderness present.     Left Sinus: Maxillary sinus tenderness and frontal sinus tenderness present.     Mouth/Throat:     Pharynx: Oropharynx is clear.  Eyes:     Extraocular Movements: Extraocular movements intact.     Conjunctiva/sclera:  Conjunctivae normal.     Pupils: Pupils are equal, round, and reactive to light.  Cardiovascular:     Rate and Rhythm: Normal rate and regular rhythm.  Pulmonary:     Effort: Pulmonary effort is normal.     Breath sounds: Normal breath sounds.  Musculoskeletal:        General: Normal range of motion.     Cervical back: Normal range of motion.  Skin:    General: Skin is warm and dry.  Neurological:     General: No focal deficit present.     Mental Status: She is alert and oriented to person, place, and time.  Psychiatric:        Speech: Speech is rapid and pressured and tangential.        Behavior: Behavior normal.       Assessment & Plan:   Mood disorder, Anxiety, Depression, PTSD PHQ and GAD numbners did improve over last 3 months.  - Continue Fluoxetine but increased  to 40mg  daily. - Continue Lamictal but increased to 50mg  daily. - Continue Buspar 10MG  - Continue Clonazepam 0.5MG  PRN - Referral to ambulatory psychiatry.  Continue to suspect Bipolar 1.  -CMP ordered for medication management.   Sinusitis - Start Azithromycin - Continue Mucinex  -Flonase as needed.   Night sweats - Patient admits she sees her OBGYN frequently. She has had hysterectomy but uncertain as to if ovaries were removed or not. discussed following up with them to discuss menopausal symptoms and potential hormonal replacement therapy. If ovaries in tact could check hormones. If persist issue could look at medication causes etc.   Follow up in 3 months.   Marland KitchenVernetta Honey PA-C, have reviewed and agree with the above documentation in it's entirety.

## 2019-12-01 LAB — CBC
HCT: 41.3 %
Hemoglobin: 13.7 g/dL
MCH: 28.5 pg
MCHC: 33.2 g/dL
MCV: 85.9 fL
MPV: 10.2 fL
Platelets: 228 10*3/uL
RBC: 4.81 10*6/uL
RDW: 12.1 %
WBC: 7.2 10*3/uL

## 2019-12-01 LAB — COMPLETE METABOLIC PANEL WITH GFR
AG Ratio: 1.9 (calc)
ALT: 17 U/L
AST: 14 U/L
Albumin: 4.4 g/dL
Alkaline phosphatase (APISO): 78 U/L
BUN: 14 mg/dL
CO2: 21 mmol/L
Calcium: 9.6 mg/dL
Chloride: 107 mmol/L
Creat: 0.78 mg/dL
GFR, Est African American: 56 mL/min/{1.73_m2}
GFR, Est Non African American: 48 mL/min/{1.73_m2}
Globulin: 2.3 g/dL (calc)
Glucose, Bld: 96 mg/dL
Potassium: 3.9 mmol/L
Sodium: 137 mmol/L
Total Bilirubin: 0.6 mg/dL
Total Protein: 6.7 g/dL

## 2019-12-02 NOTE — Progress Notes (Signed)
Labs look good.

## 2019-12-20 ENCOUNTER — Telehealth: Payer: Self-pay | Admitting: Physician Assistant

## 2019-12-20 NOTE — Telephone Encounter (Signed)
She said she wants your opinion on it since the original surgeon you sent her to was willing to operate but the [facility] one didn't want to. Even after she said she's done PT and none of it has helped. She said she just wants to get the surgery over with.

## 2019-12-20 NOTE — Telephone Encounter (Signed)
He is trying to keep her out of the operating room, potentially this can improve with physical therapy.  Why has she not asked this question to him?

## 2019-12-20 NOTE — Telephone Encounter (Signed)
Pt came in and did not want appt. She has a question about how much more physical therapy she needs before surgery. She stated she was referred by you to see someone to get ready for surgery but workers comp made her got Gwendolyn Bowen (does not know the name of the doctor). This Technical sales engineer said she can not have surgery until after she does even more physical therapy. Gwendolyn Bowen is trying to get around going back to PT since she has done so much of it and the surgeon you sent her to was willing to do surgery right away. She is confused on why this other Technical sales engineer is insisting on PT.

## 2020-01-09 ENCOUNTER — Ambulatory Visit (HOSPITAL_COMMUNITY): Payer: Self-pay | Admitting: Licensed Clinical Social Worker

## 2020-01-09 ENCOUNTER — Ambulatory Visit (INDEPENDENT_AMBULATORY_CARE_PROVIDER_SITE_OTHER): Payer: 59 | Admitting: Licensed Clinical Social Worker

## 2020-01-09 ENCOUNTER — Other Ambulatory Visit: Payer: Self-pay

## 2020-01-09 DIAGNOSIS — F331 Major depressive disorder, recurrent, moderate: Secondary | ICD-10-CM

## 2020-01-09 DIAGNOSIS — F4312 Post-traumatic stress disorder, chronic: Secondary | ICD-10-CM | POA: Diagnosis not present

## 2020-01-09 DIAGNOSIS — F411 Generalized anxiety disorder: Secondary | ICD-10-CM | POA: Diagnosis not present
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
## 2020-01-11 ENCOUNTER — Ambulatory Visit (HOSPITAL_COMMUNITY): Payer: Self-pay | Admitting: Licensed Clinical Social Worker

## 2020-02-29 ENCOUNTER — Ambulatory Visit: Payer: Medicaid Other | Admitting: Physician Assistant

## 2020-03-02 ENCOUNTER — Ambulatory Visit (INDEPENDENT_AMBULATORY_CARE_PROVIDER_SITE_OTHER): Payer: Self-pay | Admitting: Physician Assistant

## 2020-03-02 DIAGNOSIS — Z5329 Procedure and treatment not carried out because of patient's decision for other reasons: Secondary | ICD-10-CM

## 2020-03-02 NOTE — Progress Notes (Signed)
No show

## 2020-03-12 ENCOUNTER — Other Ambulatory Visit (HOSPITAL_BASED_OUTPATIENT_CLINIC_OR_DEPARTMENT_OTHER): Payer: Self-pay | Admitting: Family Medicine

## 2020-03-13 ENCOUNTER — Other Ambulatory Visit: Payer: Self-pay

## 2020-03-13 ENCOUNTER — Encounter (HOSPITAL_COMMUNITY): Payer: Self-pay | Admitting: Licensed Clinical Social Worker

## 2020-03-13 ENCOUNTER — Ambulatory Visit (INDEPENDENT_AMBULATORY_CARE_PROVIDER_SITE_OTHER): Payer: 59 | Admitting: Licensed Clinical Social Worker

## 2020-03-13 DIAGNOSIS — F331 Major depressive disorder, recurrent, moderate: Secondary | ICD-10-CM | POA: Diagnosis not present

## 2020-03-13 DIAGNOSIS — F4312 Post-traumatic stress disorder, chronic: Secondary | ICD-10-CM

## 2020-03-13 DIAGNOSIS — F411 Generalized anxiety disorder: Secondary | ICD-10-CM | POA: Diagnosis not present

## 2020-03-13 NOTE — Progress Notes (Signed)
Virtual Visit via Phone Note  I connected with Gwendolyn Bowen on 03/13/20 at  9:00 AM EDT by a phone-enabled telemedicine application and verified that I am speaking with the correct person using two identifiers.   I discussed the limitations of evaluation and management by telemedicine and the availability of in person appointments. The patient expressed understanding and agreed to proceed.  LOCATION:  Patient: Parking lot Provider: Home  History of Present Illness:  Pt was referred to therapy by psychiatrist for depression and anxiety.    Observations/Objective: Patient returns to therapy after over a years absence. Patient described her current life events and psychiatric symptoms. "I need to come back to therapy." Spent a considerable amount of time re-building a trusting, therapeutic relationship and gaining background information. Patient appears to be in the same situation since I saw her last: no steady living situation, expecting people to bail her out, demanding she get her way, not providing a stable environment for her children. Discussed with patient conditions of returning to therapy: setting goals w/strategies, being willing to change. If patient completes these tasks she is to call back to schedule additional appointments.   Assessment and Plan: Counselor will continue to meet with patient to address treatment plan goals. Patient will continue to follow recommendations of providers and implement skills learned in session.  Follow Up Instructions:   I discussed the assessment and treatment plan with the patient. The patient was provided an opportunity to ask questions and all were answered. The patient agreed with the plan and demonstrated an understanding of the instructions.   The patient was advised to call back or seek an in-person evaluation if the symptoms worsen or if the condition fails to improve as anticipated.  I provided 60 minutes of non-face-to-face time during  this encounter.   Harshan Kearley S, LCAS

## 2020-03-20 ENCOUNTER — Other Ambulatory Visit: Payer: Self-pay | Admitting: Physician Assistant

## 2020-03-20 DIAGNOSIS — F32A Depression, unspecified: Secondary | ICD-10-CM

## 2020-03-20 DIAGNOSIS — F4312 Post-traumatic stress disorder, chronic: Secondary | ICD-10-CM

## 2020-03-20 DIAGNOSIS — F419 Anxiety disorder, unspecified: Secondary | ICD-10-CM

## 2020-03-20 DIAGNOSIS — F411 Generalized anxiety disorder: Secondary | ICD-10-CM

## 2020-03-21 ENCOUNTER — Other Ambulatory Visit (HOSPITAL_BASED_OUTPATIENT_CLINIC_OR_DEPARTMENT_OTHER): Payer: Self-pay | Admitting: Physician Assistant

## 2020-03-26 ENCOUNTER — Ambulatory Visit (INDEPENDENT_AMBULATORY_CARE_PROVIDER_SITE_OTHER): Payer: Managed Care, Other (non HMO) | Admitting: Physician Assistant

## 2020-03-26 ENCOUNTER — Encounter: Payer: Self-pay | Admitting: Physician Assistant

## 2020-03-26 ENCOUNTER — Other Ambulatory Visit: Payer: Self-pay

## 2020-03-26 ENCOUNTER — Other Ambulatory Visit (HOSPITAL_BASED_OUTPATIENT_CLINIC_OR_DEPARTMENT_OTHER): Payer: Self-pay | Admitting: Physician Assistant

## 2020-03-26 DIAGNOSIS — F32A Depression, unspecified: Secondary | ICD-10-CM

## 2020-03-26 DIAGNOSIS — F4312 Post-traumatic stress disorder, chronic: Secondary | ICD-10-CM

## 2020-03-26 DIAGNOSIS — F331 Major depressive disorder, recurrent, moderate: Secondary | ICD-10-CM | POA: Diagnosis not present

## 2020-03-26 DIAGNOSIS — F411 Generalized anxiety disorder: Secondary | ICD-10-CM | POA: Diagnosis not present

## 2020-03-26 DIAGNOSIS — F39 Unspecified mood [affective] disorder: Secondary | ICD-10-CM | POA: Diagnosis not present

## 2020-03-26 DIAGNOSIS — F419 Anxiety disorder, unspecified: Secondary | ICD-10-CM

## 2020-03-26 MED ORDER — BUSPIRONE HCL 10 MG PO TABS: 10.0000 mg | ORAL_TABLET | Freq: Two times a day (BID) | ORAL | 1 refills | Status: DC

## 2020-03-26 MED ORDER — LAMOTRIGINE 100 MG PO TABS: 100.0000 mg | ORAL_TABLET | Freq: Every day | ORAL | 1 refills | Status: DC

## 2020-03-26 MED ORDER — CLONAZEPAM 0.5 MG PO TABS: 0.5000 mg | ORAL_TABLET | Freq: Two times a day (BID) | ORAL | 1 refills | Status: DC | PRN

## 2020-03-26 MED ORDER — FLUOXETINE HCL 40 MG PO CAPS: 40.0000 mg | ORAL_CAPSULE | Freq: Every day | ORAL | 1 refills | Status: DC

## 2020-03-26 NOTE — Patient Instructions (Signed)
Increased lamictal to 100mg  daily.  Continue on buspar and prozac.
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
## 2020-05-04 ENCOUNTER — Ambulatory Visit (INDEPENDENT_AMBULATORY_CARE_PROVIDER_SITE_OTHER): Payer: Managed Care, Other (non HMO) | Admitting: Physician Assistant

## 2020-05-04 ENCOUNTER — Encounter: Payer: Self-pay | Admitting: Physician Assistant

## 2020-05-04 ENCOUNTER — Other Ambulatory Visit: Payer: Self-pay

## 2020-05-04 VITALS — BP 111/90 | HR 86 | Ht 63.0 in | Wt 163.0 lb

## 2020-05-04 DIAGNOSIS — H9202 Otalgia, left ear: Secondary | ICD-10-CM | POA: Diagnosis not present

## 2020-05-04 MED ORDER — METHYLPREDNISOLONE 4 MG PO TBPK: ORAL_TABLET | ORAL | 0 refills | Status: DC

## 2020-05-04 MED ORDER — OFLOXACIN 0.3 % OT SOLN: 10.0000 [drp] | Freq: Every day | OTIC | 0 refills | Status: DC
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
## 2020-05-09 ENCOUNTER — Other Ambulatory Visit: Payer: Self-pay | Admitting: Physician Assistant

## 2020-05-09 DIAGNOSIS — F39 Unspecified mood [affective] disorder: Secondary | ICD-10-CM

## 2020-05-09 DIAGNOSIS — F331 Major depressive disorder, recurrent, moderate: Secondary | ICD-10-CM

## 2020-05-09 DIAGNOSIS — F411 Generalized anxiety disorder: Secondary | ICD-10-CM

## 2020-05-09 DIAGNOSIS — F4312 Post-traumatic stress disorder, chronic: Secondary | ICD-10-CM

## 2020-06-26 ENCOUNTER — Ambulatory Visit: Payer: Medicaid Other | Admitting: Physician Assistant

## 2020-07-11 ENCOUNTER — Encounter: Payer: Self-pay | Admitting: Physician Assistant

## 2020-07-11 ENCOUNTER — Other Ambulatory Visit (HOSPITAL_BASED_OUTPATIENT_CLINIC_OR_DEPARTMENT_OTHER): Payer: Self-pay | Admitting: Physician Assistant

## 2020-07-11 ENCOUNTER — Telehealth (INDEPENDENT_AMBULATORY_CARE_PROVIDER_SITE_OTHER): Payer: Managed Care, Other (non HMO) | Admitting: Physician Assistant

## 2020-07-11 DIAGNOSIS — R059 Cough, unspecified: Secondary | ICD-10-CM

## 2020-07-11 DIAGNOSIS — J329 Chronic sinusitis, unspecified: Secondary | ICD-10-CM | POA: Diagnosis not present

## 2020-07-11 DIAGNOSIS — J40 Bronchitis, not specified as acute or chronic: Secondary | ICD-10-CM

## 2020-07-11 DIAGNOSIS — J029 Acute pharyngitis, unspecified: Secondary | ICD-10-CM

## 2020-07-11 DIAGNOSIS — R0981 Nasal congestion: Secondary | ICD-10-CM

## 2020-07-11 MED ORDER — AZITHROMYCIN 250 MG PO TABS: ORAL_TABLET | ORAL | 0 refills | Status: DC

## 2020-07-11 MED ORDER — BENZONATATE 200 MG PO CAPS: 200.0000 mg | ORAL_CAPSULE | Freq: Two times a day (BID) | ORAL | 0 refills | Status: DC | PRN

## 2020-07-11 MED ORDER — ALBUTEROL SULFATE HFA 108 (90 BASE) MCG/ACT IN AERS: 2.0000 | INHALATION_SPRAY | RESPIRATORY_TRACT | 0 refills | Status: DC | PRN

## 2020-07-11 NOTE — Progress Notes (Deleted)
Pt stated it started jan 14th  coughing soar throat stuffy and runny nose took OTC dayquil and  nyquil muscinex  No known covid covide.   Used
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
## 2020-07-13 LAB — NOVEL CORONAVIRUS, NAA: SARS-CoV-2, NAA: NOT DETECTED

## 2020-07-13 LAB — SARS-COV-2, NAA 2 DAY TAT

## 2020-07-13 LAB — SPECIMEN STATUS REPORT

## 2020-07-13 NOTE — Progress Notes (Signed)
Negative for covid. Ok to go back to work.

## 2020-09-19 ENCOUNTER — Other Ambulatory Visit (HOSPITAL_BASED_OUTPATIENT_CLINIC_OR_DEPARTMENT_OTHER): Payer: Self-pay | Admitting: Family Medicine

## 2020-11-06 ENCOUNTER — Other Ambulatory Visit: Payer: Self-pay | Admitting: Neurology

## 2020-11-06 ENCOUNTER — Other Ambulatory Visit (HOSPITAL_BASED_OUTPATIENT_CLINIC_OR_DEPARTMENT_OTHER): Payer: Self-pay

## 2020-11-06 MED ORDER — FLUOXETINE HCL 40 MG PO CAPS
ORAL_CAPSULE | Freq: Every day | ORAL | 0 refills | Status: DC
  Filled 2020-11-06: qty 30, 30d supply, fill #0
  Filled 2021-02-18: qty 30, 30d supply, fill #1

## 2020-11-07 ENCOUNTER — Ambulatory Visit (INDEPENDENT_AMBULATORY_CARE_PROVIDER_SITE_OTHER): Payer: Managed Care, Other (non HMO) | Admitting: Physician Assistant

## 2020-11-07 ENCOUNTER — Other Ambulatory Visit (HOSPITAL_BASED_OUTPATIENT_CLINIC_OR_DEPARTMENT_OTHER): Payer: Self-pay

## 2020-11-07 ENCOUNTER — Other Ambulatory Visit: Payer: Self-pay

## 2020-11-07 ENCOUNTER — Encounter: Payer: Self-pay | Admitting: Physician Assistant

## 2020-11-07 VITALS — BP 93/52 | HR 76 | Ht 63.0 in | Wt 156.0 lb

## 2020-11-07 DIAGNOSIS — F419 Anxiety disorder, unspecified: Secondary | ICD-10-CM | POA: Diagnosis not present

## 2020-11-07 DIAGNOSIS — F4312 Post-traumatic stress disorder, chronic: Secondary | ICD-10-CM

## 2020-11-07 DIAGNOSIS — F32A Depression, unspecified: Secondary | ICD-10-CM

## 2020-11-07 DIAGNOSIS — F411 Generalized anxiety disorder: Secondary | ICD-10-CM

## 2020-11-07 DIAGNOSIS — F5105 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 disorder: Secondary | ICD-10-CM

## 2020-11-07 DIAGNOSIS — J4520 Mild intermittent asthma, uncomplicated: Secondary | ICD-10-CM

## 2020-11-07 DIAGNOSIS — F3181 Bipolar II disorder: Secondary | ICD-10-CM | POA: Diagnosis not present

## 2020-11-07 DIAGNOSIS — F331 Major depressive disorder, recurrent, moderate: Secondary | ICD-10-CM

## 2020-11-07 DIAGNOSIS — F39 Unspecified mood [affective] disorder: Secondary | ICD-10-CM

## 2020-11-07 DIAGNOSIS — F99 Mental disorder, not otherwise specified: Secondary | ICD-10-CM

## 2020-11-07 DIAGNOSIS — T7491XA Unspecified adult maltreatment, confirmed, initial encounter: Secondary | ICD-10-CM

## 2020-11-07 MED ORDER — CARIPRAZINE HCL 1.5 MG PO CAPS
1.5000 mg | ORAL_CAPSULE | Freq: Every day | ORAL | 2 refills | Status: DC
Start: 2020-11-07 — End: 2021-06-04
  Filled 2020-11-07: qty 30, 30d supply, fill #0
  Filled 2021-02-18: qty 30, 30d supply, fill #1

## 2020-11-07 MED ORDER — BUSPIRONE HCL 10 MG PO TABS
10.0000 mg | ORAL_TABLET | Freq: Three times a day (TID) | ORAL | 1 refills | Status: DC
  Filled 2020-11-07: qty 90, 30d supply, fill #0
  Filled 2021-02-18: qty 90, 30d supply, fill #1

## 2020-11-07 MED ORDER — ALBUTEROL SULFATE HFA 108 (90 BASE) MCG/ACT IN AERS
INHALATION_SPRAY | RESPIRATORY_TRACT | 0 refills | Status: DC
  Filled 2020-11-07: qty 8.5, 17d supply, fill #0

## 2020-11-07 MED ORDER — CLONAZEPAM 0.5 MG PO TABS
0.5000 mg | ORAL_TABLET | Freq: Two times a day (BID) | ORAL | 1 refills | Status: DC | PRN
  Filled 2020-11-07: qty 60, 30d supply, fill #0
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
## 2020-11-08 ENCOUNTER — Other Ambulatory Visit (HOSPITAL_BASED_OUTPATIENT_CLINIC_OR_DEPARTMENT_OTHER): Payer: Self-pay

## 2020-11-09 DIAGNOSIS — J45909 Unspecified asthma, uncomplicated: Secondary | ICD-10-CM | POA: Insufficient documentation

## 2020-11-09 DIAGNOSIS — T7491XA Unspecified adult maltreatment, confirmed, initial encounter: Secondary | ICD-10-CM | POA: Insufficient documentation

## 2020-11-27 ENCOUNTER — Other Ambulatory Visit (HOSPITAL_BASED_OUTPATIENT_CLINIC_OR_DEPARTMENT_OTHER): Payer: Self-pay

## 2020-11-27 MED ORDER — PROMETHAZINE HCL 25 MG PO TABS
ORAL_TABLET | ORAL | 1 refills | Status: DC
  Filled 2020-11-27: qty 60, 15d supply, fill #0

## 2020-11-27 MED ORDER — RIZATRIPTAN BENZOATE 10 MG PO TABS
ORAL_TABLET | ORAL | 3 refills | Status: DC
  Filled 2020-11-27: qty 9, 15d supply, fill #0
  Filled 2021-01-21: qty 9, 15d supply, fill #1

## 2020-11-27 MED ORDER — BACLOFEN 10 MG PO TABS
ORAL_TABLET | ORAL | 2 refills | Status: DC
  Filled 2020-11-27: qty 45, 15d supply, fill #0

## 2020-11-27 MED ORDER — EMGALITY 120 MG/ML ~~LOC~~ SOAJ
SUBCUTANEOUS | 11 refills | Status: DC
  Filled 2020-11-27 – 2020-11-28 (×2): qty 1, 30d supply, fill #0

## 2020-11-27 MED ORDER — ZONISAMIDE 100 MG PO CAPS
ORAL_CAPSULE | ORAL | 1 refills | Status: DC
  Filled 2020-11-27: qty 60, 30d supply, fill #0
  Filled 2021-02-18: qty 60, 30d supply, fill #1

## 2020-11-28 ENCOUNTER — Other Ambulatory Visit (HOSPITAL_BASED_OUTPATIENT_CLINIC_OR_DEPARTMENT_OTHER): Payer: Self-pay

## 2020-11-30 ENCOUNTER — Other Ambulatory Visit (HOSPITAL_BASED_OUTPATIENT_CLINIC_OR_DEPARTMENT_OTHER): Payer: Self-pay

## 2020-12-04 ENCOUNTER — Other Ambulatory Visit (HOSPITAL_BASED_OUTPATIENT_CLINIC_OR_DEPARTMENT_OTHER): Payer: Self-pay

## 2020-12-05 ENCOUNTER — Other Ambulatory Visit (HOSPITAL_BASED_OUTPATIENT_CLINIC_OR_DEPARTMENT_OTHER): Payer: Self-pay

## 2020-12-06 ENCOUNTER — Other Ambulatory Visit (HOSPITAL_BASED_OUTPATIENT_CLINIC_OR_DEPARTMENT_OTHER): Payer: Self-pay

## 2021-01-07 ENCOUNTER — Ambulatory Visit: Payer: Managed Care, Other (non HMO) | Admitting: Physician Assistant

## 2021-01-21 ENCOUNTER — Other Ambulatory Visit (HOSPITAL_BASED_OUTPATIENT_CLINIC_OR_DEPARTMENT_OTHER): Payer: Self-pay

## 2021-01-27 ENCOUNTER — Encounter (HOSPITAL_BASED_OUTPATIENT_CLINIC_OR_DEPARTMENT_OTHER): Payer: Self-pay | Admitting: Emergency Medicine

## 2021-01-27 ENCOUNTER — Emergency Department (HOSPITAL_BASED_OUTPATIENT_CLINIC_OR_DEPARTMENT_OTHER): Payer: Medicaid Other

## 2021-01-27 ENCOUNTER — Emergency Department (HOSPITAL_BASED_OUTPATIENT_CLINIC_OR_DEPARTMENT_OTHER)
Admission: EM | Admit: 2021-01-27 | Discharge: 2021-01-27 | Disposition: A | Discharge status: Home / Self Care | Payer: Medicaid Other | Attending: Emergency Medicine | Admitting: Emergency Medicine

## 2021-01-27 ENCOUNTER — Other Ambulatory Visit: Payer: Self-pay

## 2021-01-27 DIAGNOSIS — Y9301 Activity, walking, marching and hiking: Secondary | ICD-10-CM | POA: Insufficient documentation

## 2021-01-27 DIAGNOSIS — X501XXA Overexertion from prolonged static or awkward postures, initial encounter: Secondary | ICD-10-CM | POA: Insufficient documentation

## 2021-01-27 DIAGNOSIS — S93402A Sprain of unspecified ligament of left ankle, initial encounter: Secondary | ICD-10-CM | POA: Insufficient documentation

## 2021-01-27 DIAGNOSIS — S99912A Unspecified injury of left ankle, initial encounter: Secondary | ICD-10-CM | POA: Diagnosis present

## 2021-01-27 MED ORDER — HYDROCODONE-ACETAMINOPHEN 5-325 MG PO TABS
1.0000 | ORAL_TABLET | Freq: Four times a day (QID) | ORAL | 0 refills | Status: DC | PRN
  Filled 2021-01-27: qty 6, 2d supply, fill #0

## 2021-01-27 MED ORDER — HYDROCODONE-ACETAMINOPHEN 5-325 MG PO TABS
1.0000 | ORAL_TABLET | Freq: Once | ORAL | Status: AC
  Administered 2021-01-27: 1 via ORAL
  Filled 2021-01-27: qty 1

## 2021-01-27 NOTE — ED Triage Notes (Signed)
Pt presents to ED POV. Pt c/o L ankle pain. Pt reports mechanical fall. Pt denies head injury and/or LOC.
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
## 2021-01-27 NOTE — ED Notes (Signed)
Ice pack applied to ankle

## 2021-01-28 ENCOUNTER — Other Ambulatory Visit (HOSPITAL_BASED_OUTPATIENT_CLINIC_OR_DEPARTMENT_OTHER): Payer: Self-pay

## 2021-02-12 ENCOUNTER — Other Ambulatory Visit (HOSPITAL_BASED_OUTPATIENT_CLINIC_OR_DEPARTMENT_OTHER): Payer: Self-pay

## 2021-02-12 ENCOUNTER — Other Ambulatory Visit: Payer: Self-pay

## 2021-02-12 ENCOUNTER — Ambulatory Visit (INDEPENDENT_AMBULATORY_CARE_PROVIDER_SITE_OTHER): Payer: Medicaid Other | Admitting: Sports Medicine

## 2021-02-12 DIAGNOSIS — S93492A Sprain of other ligament of left ankle, initial encounter: Secondary | ICD-10-CM | POA: Diagnosis not present

## 2021-02-12 DIAGNOSIS — S93402A Sprain of unspecified ligament of left ankle, initial encounter: Secondary | ICD-10-CM | POA: Insufficient documentation

## 2021-02-12 MED ORDER — PROGESTERONE MICRONIZED 100 MG PO CAPS
200.0000 mg | ORAL_CAPSULE | Freq: Every day | ORAL | 11 refills | Status: DC
  Filled 2021-02-12: qty 30, 15d supply, fill #0

## 2021-02-12 MED ORDER — OXYCODONE-ACETAMINOPHEN 5-325 MG PO TABS
1.0000 | ORAL_TABLET | Freq: Three times a day (TID) | ORAL | 0 refills | Status: DC | PRN
  Filled 2021-02-12: qty 15, 5d supply, fill #0

## 2021-02-12 NOTE — Progress Notes (Signed)
    Procedures performed today:    None.  Independent interpretation of notes and tests performed by another provider:   None.  Brief History, Exam, Impression, and Recommendations:    Sprain of ankle, left This is a pleasant 40 year old female, 2 weeks ago she had an inversion injury of her left ankle, immediate pain, swelling, bruising, x-rays were negative for fracture, on exam she still has residual swelling, tenderness at the ATFL but a negative anterior drawer sign. Continue boot for approximately 3 to 4 weeks, return to see me afterwards. Will consider cast placement if insufficiently better. Oxycodone for pain.    ___________________________________________ Ihor Austin. Benjamin Stain, M.D., ABFM., CAQSM. Primary Care and Sports Medicine [facility]  Adjunct Instructor of Family Medicine  [employer]

## 2021-02-12 NOTE — Assessment & Plan Note (Signed)
This is a pleasant 40 year old female, 2 weeks ago she had an inversion injury of her left ankle, immediate pain, swelling, bruising, x-rays were negative for fracture, on exam she still has residual swelling, tenderness at the ATFL but a negative anterior drawer sign. Continue boot for approximately 3 to 4 weeks, return to see me afterwards. Will consider cast placement if insufficiently better. Oxycodone for pain.

## 2021-02-18 ENCOUNTER — Other Ambulatory Visit (HOSPITAL_BASED_OUTPATIENT_CLINIC_OR_DEPARTMENT_OTHER): Payer: Self-pay

## 2021-02-19 ENCOUNTER — Other Ambulatory Visit (HOSPITAL_BASED_OUTPATIENT_CLINIC_OR_DEPARTMENT_OTHER): Payer: Self-pay

## 2021-02-21 ENCOUNTER — Other Ambulatory Visit (HOSPITAL_BASED_OUTPATIENT_CLINIC_OR_DEPARTMENT_OTHER): Payer: Self-pay

## 2021-03-01 ENCOUNTER — Other Ambulatory Visit (HOSPITAL_BASED_OUTPATIENT_CLINIC_OR_DEPARTMENT_OTHER): Payer: Self-pay

## 2021-03-04 ENCOUNTER — Other Ambulatory Visit (HOSPITAL_BASED_OUTPATIENT_CLINIC_OR_DEPARTMENT_OTHER): Payer: Self-pay

## 2021-03-05 ENCOUNTER — Other Ambulatory Visit (HOSPITAL_BASED_OUTPATIENT_CLINIC_OR_DEPARTMENT_OTHER): Payer: Self-pay

## 2021-03-07 ENCOUNTER — Emergency Department (HOSPITAL_COMMUNITY)
Admission: EM | Admit: 2021-03-07 | Discharge: 2021-03-07 | Disposition: A | Discharge status: Home / Self Care | Payer: Medicaid Other | Attending: Emergency Medicine | Admitting: Emergency Medicine

## 2021-03-07 ENCOUNTER — Other Ambulatory Visit: Payer: Self-pay

## 2021-03-07 DIAGNOSIS — R010 Benign and innocent cardiac murmurs: Secondary | ICD-10-CM | POA: Insufficient documentation

## 2021-03-07 DIAGNOSIS — G43109 Migraine with aura, not intractable, without status migrainosus: Secondary | ICD-10-CM | POA: Diagnosis not present

## 2021-03-07 DIAGNOSIS — R55 Syncope and collapse: Secondary | ICD-10-CM | POA: Diagnosis not present

## 2021-03-07 DIAGNOSIS — R519 Headache, unspecified: Secondary | ICD-10-CM | POA: Diagnosis present

## 2021-03-07 LAB — COMPREHENSIVE METABOLIC PANEL
ALT: 15 U/L (ref 0–44)
AST: 14 U/L — ABNORMAL LOW (ref 15–41)
Albumin: 4.5 g/dL (ref 3.5–5.0)
Alkaline Phosphatase: 70 U/L (ref 38–126)
Anion gap: 6 (ref 5–15)
BUN: 19 mg/dL (ref 6–20)
CO2: 25 mmol/L (ref 22–32)
Calcium: 8.8 mg/dL — ABNORMAL LOW (ref 8.9–10.3)
Chloride: 106 mmol/L (ref 98–111)
Creatinine, Ser: 0.85 mg/dL (ref 0.44–1.00)
GFR, Estimated: >60 mL/min (ref >60)
Glucose, Bld: 88 mg/dL (ref 70–99)
Potassium: 3.8 mmol/L (ref 3.5–5.1)
Sodium: 137 mmol/L (ref 135–145)
Total Bilirubin: 0.6 mg/dL (ref 0.3–1.2)
Total Protein: 7.6 g/dL (ref 6.5–8.1)

## 2021-03-07 LAB — CBC WITH DIFFERENTIAL/PLATELET
Abs Immature Granulocytes: 0.05 10*3/uL (ref 0.00–0.07)
Basophils Absolute: 0 10*3/uL (ref 0.0–0.1)
Basophils Relative: 0 %
Eosinophils Absolute: 0 10*3/uL (ref 0.0–0.5)
Eosinophils Relative: 0 %
HCT: 43.4 % (ref 36.0–46.0)
Hemoglobin: 14.5 g/dL (ref 12.0–15.0)
Immature Granulocytes: 1 %
Lymphocytes Relative: 21 %
Lymphs Abs: 1.9 10*3/uL (ref 0.7–4.0)
MCH: 29.1 pg (ref 26.0–34.0)
MCHC: 33.4 g/dL (ref 30.0–36.0)
MCV: 87.1 fL (ref 80.0–100.0)
Monocytes Absolute: 0.4 10*3/uL (ref 0.1–1.0)
Monocytes Relative: 5 %
Neutro Abs: 6.7 10*3/uL (ref 1.7–7.7)
Neutrophils Relative %: 73 %
Platelets: 228 10*3/uL (ref 150–400)
RBC: 4.98 MIL/uL (ref 3.87–5.11)
RDW: 12.2 % (ref 11.5–15.5)
WBC: 9.1 10*3/uL (ref 4.0–10.5)
nRBC: 0 % (ref 0.0–0.2)

## 2021-03-07 LAB — CBG MONITORING, ED: Glucose-Capillary: 91 mg/dL (ref 70–99)

## 2021-03-07 MED ORDER — PROCHLORPERAZINE MALEATE 10 MG PO TABS
10.0000 mg | ORAL_TABLET | Freq: Once | ORAL | Status: AC
  Administered 2021-03-07: 10 mg via ORAL
  Filled 2021-03-07: qty 1

## 2021-03-07 MED ORDER — ACETAMINOPHEN 325 MG PO TABS
650.0000 mg | ORAL_TABLET | Freq: Once | ORAL | Status: AC
  Administered 2021-03-07: 650 mg via ORAL
  Filled 2021-03-07: qty 2

## 2021-03-07 MED ORDER — DEXAMETHASONE SODIUM PHOSPHATE 10 MG/ML IJ SOLN
10.0000 mg | Freq: Once | INTRAMUSCULAR | Status: AC
  Administered 2021-03-07: 10 mg via INTRAVENOUS
  Filled 2021-03-07: qty 1

## 2021-03-07 MED ORDER — DIPHENHYDRAMINE HCL 50 MG/ML IJ SOLN
25.0000 mg | Freq: Once | INTRAMUSCULAR | Status: AC
  Administered 2021-03-07: 25 mg via INTRAVENOUS
  Filled 2021-03-07: qty 1

## 2021-03-07 MED ORDER — SODIUM CHLORIDE 0.9 % IV BOLUS
1000.0000 mL | Freq: Once | INTRAVENOUS | Status: AC
  Administered 2021-03-07: 1000 mL via INTRAVENOUS

## 2021-03-07 MED ORDER — DIPHENHYDRAMINE HCL 50 MG/ML IJ SOLN
INTRAMUSCULAR | Status: AC
  Filled 2021-03-07: qty 1
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
## 2021-03-07 NOTE — Discharge Instructions (Addendum)
You labwork is reassuring, I am glad to hear that your headache is improved today.  Please follow-up with your neurologist at your earliest convenience to discuss your migraine treatment, continued syncopal episodes.  Please return if your headache worsens, fails to improve, you continue have syncopal episodes.

## 2021-03-07 NOTE — ED Triage Notes (Addendum)
Patient reports having a witnessed syncopal episode. Pt has migraines and reports having this happen before when she has migraines. Pt reports headache 9/10.

## 2021-03-08 ENCOUNTER — Other Ambulatory Visit (HOSPITAL_BASED_OUTPATIENT_CLINIC_OR_DEPARTMENT_OTHER): Payer: Self-pay

## 2021-03-12 ENCOUNTER — Other Ambulatory Visit (HOSPITAL_BASED_OUTPATIENT_CLINIC_OR_DEPARTMENT_OTHER): Payer: Self-pay

## 2021-03-12 ENCOUNTER — Ambulatory Visit: Payer: Medicaid Other | Admitting: Sports Medicine

## 2021-03-15 ENCOUNTER — Other Ambulatory Visit (HOSPITAL_BASED_OUTPATIENT_CLINIC_OR_DEPARTMENT_OTHER): Payer: Self-pay

## 2021-03-18 ENCOUNTER — Other Ambulatory Visit (HOSPITAL_BASED_OUTPATIENT_CLINIC_OR_DEPARTMENT_OTHER): Payer: Self-pay

## 2021-03-19 ENCOUNTER — Other Ambulatory Visit (HOSPITAL_BASED_OUTPATIENT_CLINIC_OR_DEPARTMENT_OTHER): Payer: Self-pay

## 2021-03-21 ENCOUNTER — Other Ambulatory Visit (HOSPITAL_BASED_OUTPATIENT_CLINIC_OR_DEPARTMENT_OTHER): Payer: Self-pay

## 2021-03-22 ENCOUNTER — Other Ambulatory Visit (HOSPITAL_BASED_OUTPATIENT_CLINIC_OR_DEPARTMENT_OTHER): Payer: Self-pay

## 2021-03-26 ENCOUNTER — Other Ambulatory Visit (HOSPITAL_BASED_OUTPATIENT_CLINIC_OR_DEPARTMENT_OTHER): Payer: Self-pay

## 2021-03-29 ENCOUNTER — Other Ambulatory Visit (HOSPITAL_BASED_OUTPATIENT_CLINIC_OR_DEPARTMENT_OTHER): Payer: Self-pay

## 2021-04-01 ENCOUNTER — Other Ambulatory Visit (HOSPITAL_BASED_OUTPATIENT_CLINIC_OR_DEPARTMENT_OTHER): Payer: Self-pay

## 2021-04-02 ENCOUNTER — Other Ambulatory Visit (HOSPITAL_BASED_OUTPATIENT_CLINIC_OR_DEPARTMENT_OTHER): Payer: Self-pay

## 2021-04-05 ENCOUNTER — Other Ambulatory Visit (HOSPITAL_BASED_OUTPATIENT_CLINIC_OR_DEPARTMENT_OTHER): Payer: Self-pay

## 2021-04-09 ENCOUNTER — Other Ambulatory Visit (HOSPITAL_BASED_OUTPATIENT_CLINIC_OR_DEPARTMENT_OTHER): Payer: Self-pay

## 2021-04-15 ENCOUNTER — Other Ambulatory Visit (HOSPITAL_BASED_OUTPATIENT_CLINIC_OR_DEPARTMENT_OTHER): Payer: Self-pay

## 2021-05-10 ENCOUNTER — Other Ambulatory Visit (HOSPITAL_BASED_OUTPATIENT_CLINIC_OR_DEPARTMENT_OTHER): Payer: Self-pay

## 2021-06-03 ENCOUNTER — Other Ambulatory Visit (HOSPITAL_BASED_OUTPATIENT_CLINIC_OR_DEPARTMENT_OTHER): Payer: Self-pay

## 2021-06-03 DIAGNOSIS — F418 Other specified anxiety disorders: Secondary | ICD-10-CM | POA: Diagnosis not present

## 2021-06-03 DIAGNOSIS — Z7189 Other specified counseling: Secondary | ICD-10-CM | POA: Diagnosis not present

## 2021-06-03 DIAGNOSIS — G43709 Chronic migraine without aura, not intractable, without status migrainosus: Secondary | ICD-10-CM | POA: Diagnosis not present

## 2021-06-03 MED ORDER — AIMOVIG 140 MG/ML ~~LOC~~ SOAJ
SUBCUTANEOUS | 11 refills | Status: DC
  Filled 2021-06-03 – 2021-07-08 (×4): qty 1, 30d supply, fill #0

## 2021-06-03 MED ORDER — RIZATRIPTAN BENZOATE 10 MG PO TABS
ORAL_TABLET | ORAL | 3 refills | Status: DC
  Filled 2021-06-03: qty 9, 15d supply, fill #0
  Filled 2021-10-17: qty 9, 15d supply, fill #1
  Filled 2022-01-03: qty 9, 15d supply, fill #2
  Filled 2022-05-05: qty 9, 15d supply, fill #3

## 2021-06-03 MED ORDER — BACLOFEN 10 MG PO TABS
ORAL_TABLET | ORAL | 2 refills | Status: DC
  Filled 2021-06-03: qty 45, 15d supply, fill #0

## 2021-06-03 MED ORDER — PROMETHAZINE HCL 25 MG PO TABS
ORAL_TABLET | ORAL | 1 refills | Status: DC
  Filled 2021-06-03: qty 60, 15d supply, fill #0

## 2021-06-04 ENCOUNTER — Ambulatory Visit (INDEPENDENT_AMBULATORY_CARE_PROVIDER_SITE_OTHER): Payer: Medicaid Other | Admitting: Physician Assistant

## 2021-06-04 ENCOUNTER — Other Ambulatory Visit: Payer: Self-pay

## 2021-06-04 ENCOUNTER — Ambulatory Visit (INDEPENDENT_AMBULATORY_CARE_PROVIDER_SITE_OTHER): Payer: Medicaid Other

## 2021-06-04 ENCOUNTER — Other Ambulatory Visit (HOSPITAL_BASED_OUTPATIENT_CLINIC_OR_DEPARTMENT_OTHER): Payer: Self-pay

## 2021-06-04 VITALS — BP 110/71 | HR 94 | Ht 63.0 in | Wt 168.0 lb

## 2021-06-04 DIAGNOSIS — F3181 Bipolar II disorder: Secondary | ICD-10-CM

## 2021-06-04 DIAGNOSIS — M542 Cervicalgia: Secondary | ICD-10-CM

## 2021-06-04 DIAGNOSIS — F411 Generalized anxiety disorder: Secondary | ICD-10-CM

## 2021-06-04 DIAGNOSIS — F39 Unspecified mood [affective] disorder: Secondary | ICD-10-CM

## 2021-06-04 DIAGNOSIS — F32A Depression, unspecified: Secondary | ICD-10-CM

## 2021-06-04 DIAGNOSIS — R200 Anesthesia of skin: Secondary | ICD-10-CM

## 2021-06-04 DIAGNOSIS — M5440 Lumbago with sciatica, unspecified side: Secondary | ICD-10-CM

## 2021-06-04 DIAGNOSIS — R202 Paresthesia of skin: Secondary | ICD-10-CM

## 2021-06-04 DIAGNOSIS — F419 Anxiety disorder, unspecified: Secondary | ICD-10-CM | POA: Diagnosis not present

## 2021-06-04 DIAGNOSIS — F4312 Post-traumatic stress disorder, chronic: Secondary | ICD-10-CM | POA: Diagnosis not present

## 2021-06-04 DIAGNOSIS — F331 Major depressive disorder, recurrent, moderate: Secondary | ICD-10-CM

## 2021-06-04 MED ORDER — CLONAZEPAM 0.5 MG PO TABS
0.5000 mg | ORAL_TABLET | Freq: Two times a day (BID) | ORAL | 5 refills | Status: DC | PRN
  Filled 2021-06-04: qty 60, 30d supply, fill #0

## 2021-06-04 MED ORDER — BUSPIRONE HCL 10 MG PO TABS
10.0000 mg | ORAL_TABLET | Freq: Three times a day (TID) | ORAL | 0 refills | Status: DC
  Filled 2021-06-04: qty 270, 90d supply, fill #0

## 2021-06-04 MED ORDER — CARIPRAZINE HCL 1.5 MG PO CAPS
1.5000 mg | ORAL_CAPSULE | Freq: Every day | ORAL | 1 refills | Status: DC
  Filled 2021-06-04 (×2): qty 30, 30d supply, fill #0

## 2021-06-04 MED ORDER — FLUOXETINE HCL 20 MG PO TABS
20.0000 mg | ORAL_TABLET | Freq: Every day | ORAL | 0 refills | Status: DC
  Filled 2021-06-04: qty 90, 90d supply, fill #0
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
## 2021-06-05 ENCOUNTER — Other Ambulatory Visit (HOSPITAL_BASED_OUTPATIENT_CLINIC_OR_DEPARTMENT_OTHER): Payer: Self-pay

## 2021-06-05 NOTE — Progress Notes (Signed)
Thyroid looks great.  Kidney, liver, glucose look great.  Hemoglobin wonderful Vitamin D GREAT.  Vitamin B12 looks wonderful.   Labs pending.

## 2021-06-06 ENCOUNTER — Encounter: Payer: Self-pay | Admitting: Physician Assistant

## 2021-06-06 ENCOUNTER — Other Ambulatory Visit (HOSPITAL_BASED_OUTPATIENT_CLINIC_OR_DEPARTMENT_OTHER): Payer: Self-pay

## 2021-06-06 DIAGNOSIS — M47812 Spondylosis without myelopathy or radiculopathy, cervical region: Secondary | ICD-10-CM | POA: Insufficient documentation

## 2021-06-06 NOTE — Progress Notes (Signed)
Lumbar spine looks pretty good. Disc spaces are preserved. If pain continues we could do some PT to work on muscles that could be pulling to cause the pain.

## 2021-06-06 NOTE — Progress Notes (Signed)
You do have have some facet arthritic changes on C3-C4 and some narrowing. Treatment plan then same. Could follow up with Dr. Karie Schwalbe and see if injections could help.

## 2021-06-07 ENCOUNTER — Other Ambulatory Visit (HOSPITAL_BASED_OUTPATIENT_CLINIC_OR_DEPARTMENT_OTHER): Payer: Self-pay

## 2021-06-10 NOTE — Progress Notes (Signed)
ANA is positive and waiting on reflex testing. This just means there are some auto immune possibilites but not a diagnoses.

## 2021-06-10 NOTE — Progress Notes (Signed)
Reflux did not show any signs of autoimmune disease.

## 2021-06-12 ENCOUNTER — Other Ambulatory Visit (HOSPITAL_BASED_OUTPATIENT_CLINIC_OR_DEPARTMENT_OTHER): Payer: Self-pay

## 2021-06-12 LAB — COMPLETE METABOLIC PANEL WITH GFR
AG Ratio: 1.8 (calc) (ref 1.0–2.5)
ALT: 12 U/L (ref 6–29)
AST: 13 U/L (ref 10–30)
Albumin: 4.2 g/dL (ref 3.6–5.1)
Alkaline phosphatase (APISO): 57 U/L (ref 31–125)
BUN: 13 mg/dL (ref 7–25)
CO2: 26 mmol/L (ref 20–32)
Calcium: 9.2 mg/dL (ref 8.6–10.2)
Chloride: 107 mmol/L (ref 98–110)
Creat: 0.69 mg/dL (ref 0.50–0.97)
Globulin: 2.4 g/dL (calc) (ref 1.9–3.7)
Glucose, Bld: 94 mg/dL (ref 65–99)
Potassium: 3.8 mmol/L (ref 3.5–5.3)
Sodium: 141 mmol/L (ref 135–146)
Total Bilirubin: 0.5 mg/dL (ref 0.2–1.2)
Total Protein: 6.6 g/dL (ref 6.1–8.1)
eGFR: 114 mL/min/{1.73_m2} (ref >=60)

## 2021-06-12 LAB — TIER 1
Chromatin (Nucleosomal) Antibody: <1 AI
ENA SM Ab Ser-aCnc: <1 AI
Ribonucleic Protein(ENA) Antibody, IgG: <1 AI
SM/RNP: <1 AI
ds DNA Ab: 4 IU/mL

## 2021-06-12 LAB — ANA SCREEN,IFA,REFLEX TITER/PATTERN,REFLEX MPLX 11 AB CASCADE
14-3-3 eta Protein: <0.2 ng/mL (ref <0.2)
Anti Nuclear Antibody (ANA): POSITIVE — AB
Cyclic Citrullin Peptide Ab: <16 UNITS
Rheumatoid fact SerPl-aCnc: <14 IU/mL (ref <14)

## 2021-06-12 LAB — VITAMIN D 25 HYDROXY (VIT D DEFICIENCY, FRACTURES): Vit D, 25-Hydroxy: 68 ng/mL (ref 30–100)

## 2021-06-12 LAB — B12 AND FOLATE PANEL
Folate: 12.3 ng/mL
Vitamin B-12: 406 pg/mL (ref 200–1100)

## 2021-06-12 LAB — TIER 3
Centromere Ab Screen: <1 AI
Ribosomal P Protein Ab: <1 AI

## 2021-06-12 LAB — ANTI-NUCLEAR AB-TITER (ANA TITER): ANA Titer 1: 1:80 {titer} — ABNORMAL HIGH

## 2021-06-12 LAB — TIER 2
Jo-1 Autoabs: <1 AI
SSA (Ro) (ENA) Antibody, IgG: <1 AI
SSB (La) (ENA) Antibody, IgG: <1 AI
Scleroderma (Scl-70) (ENA) Antibody, IgG: <1 AI

## 2021-06-12 LAB — INTERPRETATION

## 2021-06-12 LAB — TSH: TSH: 1.12 mIU/L

## 2021-06-12 LAB — C-REACTIVE PROTEIN: CRP: 1.3 mg/L (ref <8.0)

## 2021-06-12 LAB — SEDIMENTATION RATE: Sed Rate: 6 mm/h (ref 0–20)

## 2021-06-18 ENCOUNTER — Other Ambulatory Visit (HOSPITAL_BASED_OUTPATIENT_CLINIC_OR_DEPARTMENT_OTHER): Payer: Self-pay

## 2021-06-20 DIAGNOSIS — H524 Presbyopia: Secondary | ICD-10-CM | POA: Diagnosis not present

## 2021-07-05 ENCOUNTER — Other Ambulatory Visit: Payer: Self-pay

## 2021-07-05 ENCOUNTER — Ambulatory Visit: Payer: Medicaid Other | Admitting: Sports Medicine

## 2021-07-05 ENCOUNTER — Other Ambulatory Visit (HOSPITAL_BASED_OUTPATIENT_CLINIC_OR_DEPARTMENT_OTHER): Payer: Self-pay

## 2021-07-05 ENCOUNTER — Ambulatory Visit (INDEPENDENT_AMBULATORY_CARE_PROVIDER_SITE_OTHER): Payer: Medicaid Other

## 2021-07-05 DIAGNOSIS — S43015D Anterior dislocation of left humerus, subsequent encounter: Secondary | ICD-10-CM

## 2021-07-05 DIAGNOSIS — S43015S Anterior dislocation of left humerus, sequela: Secondary | ICD-10-CM

## 2021-07-05 DIAGNOSIS — M25512 Pain in left shoulder: Secondary | ICD-10-CM | POA: Diagnosis not present

## 2021-07-05 MED ORDER — CELECOXIB 200 MG PO CAPS
ORAL_CAPSULE | ORAL | 2 refills | Status: DC
  Filled 2021-07-05: qty 60, 30d supply, fill #0

## 2021-07-05 NOTE — Progress Notes (Signed)
° ° °  Procedures performed today:    None.  Independent interpretation of notes and tests performed by another provider:   None.  Brief History, Exam, Impression, and Recommendations:    Anterior dislocation of left shoulder This is a 41 year old female, she currently works moving Database administrator around. She also has a history of a left shoulder anterior dislocation, ended up with a Hill-Sachs lesion, labral tear with operative debridement and labral repair back in 2021. She was doing well, then start to have increasing pain anterior left shoulder, worse with overhead activities, positive O'Brien's test, positive impingement signs. We will do x-rays, Celebrex, home conditioning. Out of work until Monday at which point she will have some lifting restrictions, would also like her to follow these restrictions in the gym. Return to see me in 4 weeks, we will do a subacromial injection if no better.    ___________________________________________ Ihor Austin. Benjamin Stain, M.D., ABFM., CAQSM. Primary Care and Sports Medicine [facility]  Adjunct Instructor of Family Medicine  [employer]

## 2021-07-05 NOTE — Assessment & Plan Note (Addendum)
This is a 41 year old female, she currently works moving Database administrator around. She also has a history of a left shoulder anterior dislocation, ended up with a Hill-Sachs lesion, labral tear with operative debridement and labral repair back in 2021. She was doing well, then start to have increasing pain anterior left shoulder, worse with overhead activities, positive O'Brien's test, positive impingement signs. We will do x-rays, Celebrex, home conditioning. Out of work until Monday at which point she will have some lifting restrictions, would also like her to follow these restrictions in the gym. Return to see me in 4 weeks, we will do a subacromial injection if no better.

## 2021-07-08 ENCOUNTER — Other Ambulatory Visit (HOSPITAL_BASED_OUTPATIENT_CLINIC_OR_DEPARTMENT_OTHER): Payer: Self-pay

## 2021-07-16 ENCOUNTER — Other Ambulatory Visit (HOSPITAL_BASED_OUTPATIENT_CLINIC_OR_DEPARTMENT_OTHER): Payer: Self-pay

## 2021-07-16 ENCOUNTER — Other Ambulatory Visit: Payer: Self-pay

## 2021-07-16 ENCOUNTER — Encounter: Payer: Self-pay | Admitting: Physician Assistant

## 2021-07-16 ENCOUNTER — Ambulatory Visit (INDEPENDENT_AMBULATORY_CARE_PROVIDER_SITE_OTHER): Payer: Medicaid Other | Admitting: Physician Assistant

## 2021-07-16 VITALS — BP 125/60 | HR 94 | Ht 63.0 in | Wt 161.0 lb

## 2021-07-16 DIAGNOSIS — F3181 Bipolar II disorder: Secondary | ICD-10-CM | POA: Diagnosis not present

## 2021-07-16 MED ORDER — CARIPRAZINE HCL 3 MG PO CAPS
3.0000 mg | ORAL_CAPSULE | Freq: Every day | ORAL | 1 refills | Status: DC
  Filled 2021-07-16: qty 30, 30d supply, fill #0
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
## 2021-07-17 ENCOUNTER — Ambulatory Visit (INDEPENDENT_AMBULATORY_CARE_PROVIDER_SITE_OTHER): Payer: Medicaid Other | Admitting: Sports Medicine

## 2021-07-17 ENCOUNTER — Other Ambulatory Visit (HOSPITAL_BASED_OUTPATIENT_CLINIC_OR_DEPARTMENT_OTHER): Payer: Self-pay

## 2021-07-17 DIAGNOSIS — S43015D Anterior dislocation of left humerus, subsequent encounter: Secondary | ICD-10-CM

## 2021-07-17 DIAGNOSIS — S43015S Anterior dislocation of left humerus, sequela: Secondary | ICD-10-CM

## 2021-07-17 NOTE — Progress Notes (Signed)
° ° °  Procedures performed today:    None.  Independent interpretation of notes and tests performed by another provider:   None.  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 More recently she started to have increasing pain anterior left shoulder worse with overhead activities, she had a positive O'Brien's test and positive impingement signs on exam. Today we did FMLA paperwork as I did take her out of work for couple weeks, continue Celebrex, I did encourage her to continue the rotator cuff conditioning, avoid overhead lifting in the gym, return to see me in 3 to 4 weeks. Subacromial injection if not better.    ___________________________________________ Ihor Austin. Benjamin Stain, M.D., ABFM., CAQSM. Primary Care and Sports Medicine [facility]  Adjunct Instructor of Family Medicine  [employer]

## 2021-07-17 NOTE — Assessment & Plan Note (Signed)
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 More recently she started to have increasing pain anterior left shoulder worse with overhead activities, she had a positive O'Brien's test and positive impingement signs on exam. Today we did FMLA paperwork as I did take her out of work for couple weeks, continue Celebrex, I did encourage her to continue the rotator cuff conditioning, avoid overhead lifting in the gym, return to see me in 3 to 4 weeks. Subacromial injection if not better.

## 2021-07-25 ENCOUNTER — Other Ambulatory Visit (HOSPITAL_BASED_OUTPATIENT_CLINIC_OR_DEPARTMENT_OTHER): Payer: Self-pay

## 2021-07-29 ENCOUNTER — Other Ambulatory Visit (HOSPITAL_BASED_OUTPATIENT_CLINIC_OR_DEPARTMENT_OTHER): Payer: Self-pay

## 2021-07-29 ENCOUNTER — Other Ambulatory Visit: Payer: Self-pay

## 2021-07-29 ENCOUNTER — Ambulatory Visit (INDEPENDENT_AMBULATORY_CARE_PROVIDER_SITE_OTHER): Payer: 59 | Admitting: Sports Medicine

## 2021-07-29 DIAGNOSIS — S93492A Sprain of other ligament of left ankle, initial encounter: Secondary | ICD-10-CM

## 2021-07-29 DIAGNOSIS — S43015S Anterior dislocation of left humerus, sequela: Secondary | ICD-10-CM

## 2021-07-29 MED ORDER — OXYCODONE-ACETAMINOPHEN 5-325 MG PO TABS
1.0000 | ORAL_TABLET | Freq: Three times a day (TID) | ORAL | 0 refills | Status: DC | PRN
  Filled 2021-07-29: qty 30, 10d supply, fill #0

## 2021-07-29 NOTE — Assessment & Plan Note (Signed)
Gwendolyn Bowen returns, she is a 41 year old female with history of an anterior dislocation on the left with a Hill-Sachs lesion, labral tear with operative debridement and repair back in 2021 with Dr. Everardo Pacific. 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 More recently she had increasing left shoulder pain worse with overhead activities, and a positive O'Brien's test as well as positive impingement signs on exam. She has been out of work and the shoulder has not surprisingly improved to some degree. She is looking for a different job right now but unfortunately we does need to be released back to full duty to get a paycheck. I am going to refill her pain medication, I would also like to go ahead and pulled the trigger for the MR arthrography to reevaluate her labrum and rule out a reinjury.

## 2021-07-29 NOTE — Progress Notes (Signed)
° ° °  Procedures performed today:    None.  Independent interpretation of notes and tests performed by another provider:   None.  Brief History, Exam, Impression, and Recommendations:    Anterior dislocation of left shoulder Gwendolyn Bowen returns, she is a 41 year old female with history of an anterior dislocation on the left with a Hill-Sachs lesion, labral tear with operative debridement and repair back in 2021 with Dr. Everardo Pacific. 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 More recently she had increasing left shoulder pain worse with overhead activities, and a positive O'Brien's test as well as positive impingement signs on exam. She has been out of work and the shoulder has not surprisingly improved to some degree. She is looking for a different job right now but unfortunately we does need to be released back to full duty to get a paycheck. I am going to refill her pain medication, I would also like to go ahead and pulled the trigger for the MR arthrography to reevaluate her labrum and rule out a reinjury.    ___________________________________________ Ihor Austin. Benjamin Stain, M.D., ABFM., CAQSM. Primary Care and Sports Medicine [facility]  Adjunct Instructor of Family Medicine  [employer]

## 2021-07-29 NOTE — Addendum Note (Signed)
Addended by: Monica Becton on: 07/29/2021 11:29 AM   Modules accepted: Orders

## 2021-08-02 ENCOUNTER — Ambulatory Visit: Payer: Medicaid Other | Admitting: Sports Medicine

## 2021-08-05 ENCOUNTER — Other Ambulatory Visit: Payer: Medicaid Other

## 2021-08-05 ENCOUNTER — Ambulatory Visit: Payer: Medicaid Other | Admitting: Sports Medicine

## 2021-08-06 ENCOUNTER — Ambulatory Visit: Payer: Medicaid Other | Admitting: Sports Medicine

## 2021-08-06 ENCOUNTER — Other Ambulatory Visit: Payer: Medicaid Other

## 2021-08-12 ENCOUNTER — Other Ambulatory Visit: Payer: Self-pay

## 2021-08-12 ENCOUNTER — Ambulatory Visit (INDEPENDENT_AMBULATORY_CARE_PROVIDER_SITE_OTHER): Payer: Medicaid Other

## 2021-08-12 ENCOUNTER — Ambulatory Visit (INDEPENDENT_AMBULATORY_CARE_PROVIDER_SITE_OTHER): Payer: 59 | Admitting: Sports Medicine

## 2021-08-12 ENCOUNTER — Ambulatory Visit (INDEPENDENT_AMBULATORY_CARE_PROVIDER_SITE_OTHER): Payer: 59

## 2021-08-12 DIAGNOSIS — M25512 Pain in left shoulder: Secondary | ICD-10-CM | POA: Diagnosis not present

## 2021-08-12 DIAGNOSIS — S43015S Anterior dislocation of left humerus, sequela: Secondary | ICD-10-CM

## 2021-08-12 NOTE — Assessment & Plan Note (Signed)
Please see prior notes, injection performed today for MR arthrography. ?

## 2021-08-12 NOTE — Progress Notes (Signed)
° ° °  Procedures performed today:    Procedure: Real-time Ultrasound Guided gadolinium contrast injection of left glenohumeral joint Device: Samsung [id number]  Verbal informed consent obtained.  Time-out conducted.  Noted no overlying erythema, induration, or other signs of local infection.  Skin prepped in a sterile fashion.  Local anesthesia: Topical Ethyl chloride.  With sterile technique and under real time ultrasound guidance: Noted normal-appearing joint, noted Hill-Sachs lesion, spinal needle advanced into the glenohumeral joint from posterior approach, I injected 1 cc kenalog 40, 2 cc lidocaine, 2 cc bupivacaine, syringe switched and 0.1 cc gadolinium injected, syringe again switched and 10 cc sterile saline used to distend the joint. Joint visualized and capsule seen distending confirming intra-articular placement of contrast material and medication. Completed without difficulty  Advised to call if fevers/chills, erythema, induration, drainage, or persistent bleeding.  Images permanently stored in PACS Impression: Technically successful ultrasound guided gadolinium contrast injection for MR arthrography.  Please see separate MR arthrogram report.  Independent interpretation of notes and tests performed by another provider:   None.  Brief History, Exam, Impression, and Recommendations:    Anterior dislocation of left shoulder Please see prior notes, injection performed today for MR arthrography.    ___________________________________________ Ihor Austin. Benjamin Stain, M.D., ABFM., CAQSM. Primary Care and Sports Medicine [place] [facility] [place] [facility]  Adjunct Instructor of Family Medicine  [employer]

## 2021-08-13 ENCOUNTER — Ambulatory Visit: Payer: Medicaid Other | Admitting: Physician Assistant

## 2021-08-22 ENCOUNTER — Other Ambulatory Visit: Payer: Self-pay

## 2021-08-22 ENCOUNTER — Encounter (HOSPITAL_BASED_OUTPATIENT_CLINIC_OR_DEPARTMENT_OTHER): Payer: Self-pay

## 2021-08-22 ENCOUNTER — Emergency Department (HOSPITAL_BASED_OUTPATIENT_CLINIC_OR_DEPARTMENT_OTHER)
Admission: EM | Admit: 2021-08-22 | Discharge: 2021-08-22 | Disposition: A | Discharge status: Home / Self Care | Payer: 59 | Attending: Emergency Medicine | Admitting: Emergency Medicine

## 2021-08-22 DIAGNOSIS — Y990 Civilian activity done for income or pay: Secondary | ICD-10-CM | POA: Diagnosis not present

## 2021-08-22 DIAGNOSIS — R110 Nausea: Secondary | ICD-10-CM | POA: Insufficient documentation

## 2021-08-22 DIAGNOSIS — I959 Hypotension, unspecified: Secondary | ICD-10-CM | POA: Diagnosis not present

## 2021-08-22 DIAGNOSIS — R55 Syncope and collapse: Secondary | ICD-10-CM | POA: Insufficient documentation

## 2021-08-22 DIAGNOSIS — G43909 Migraine, unspecified, not intractable, without status migrainosus: Secondary | ICD-10-CM | POA: Diagnosis not present

## 2021-08-22 DIAGNOSIS — Z5321 Procedure and treatment not carried out due to patient leaving prior to being seen by health care provider: Secondary | ICD-10-CM | POA: Insufficient documentation

## 2021-08-22 DIAGNOSIS — G4489 Other headache syndrome: Secondary | ICD-10-CM | POA: Diagnosis not present

## 2021-08-22 LAB — CBG MONITORING, ED: Glucose-Capillary: 88 mg/dL (ref 70–99)

## 2021-08-22 LAB — CBC
HCT: 40.8 % (ref 36.0–46.0)
Hemoglobin: 13.6 g/dL (ref 12.0–15.0)
MCH: 28.5 pg (ref 26.0–34.0)
MCHC: 33.3 g/dL (ref 30.0–36.0)
MCV: 85.5 fL (ref 80.0–100.0)
Platelets: 240 10*3/uL (ref 150–400)
RBC: 4.77 MIL/uL (ref 3.87–5.11)
RDW: 13 % (ref 11.5–15.5)
WBC: 12.2 10*3/uL — ABNORMAL HIGH (ref 4.0–10.5)
nRBC: 0 % (ref 0.0–0.2)

## 2021-08-22 LAB — BASIC METABOLIC PANEL
Anion gap: 11 (ref 5–15)
BUN: 15 mg/dL (ref 6–20)
CO2: 24 mmol/L (ref 22–32)
Calcium: 9.3 mg/dL (ref 8.9–10.3)
Chloride: 103 mmol/L (ref 98–111)
Creatinine, Ser: 0.83 mg/dL (ref 0.44–1.00)
GFR, Estimated: >60 mL/min (ref >60)
Glucose, Bld: 88 mg/dL (ref 70–99)
Potassium: 3.6 mmol/L (ref 3.5–5.1)
Sodium: 138 mmol/L (ref 135–145)

## 2021-08-22 NOTE — ED Triage Notes (Addendum)
Patient BIB [facility] from Home with Syncope and a Migraine. ? ?Patient has a History of Migraines and began to have one while at Work today. ? ?When severe, she states she may have a Syncopal Episode which she did today. [facility] responded and Patient attempted to stay Home but had another near Syncopal Event. Here for Assessment.  ? ?VSS with [facility]. Moderate Nausea. No Emesis.  ? ?NAD Noted during Triage. BIB Stretcher. A&Ox4. GCS 15.  ?

## 2021-09-03 ENCOUNTER — Ambulatory Visit: Payer: Self-pay

## 2021-09-03 ENCOUNTER — Ambulatory Visit (HOSPITAL_COMMUNITY): Payer: Self-pay

## 2021-09-13 DIAGNOSIS — Z90710 Acquired absence of both cervix and uterus: Secondary | ICD-10-CM | POA: Diagnosis not present

## 2021-09-13 DIAGNOSIS — N941 Unspecified dyspareunia: Secondary | ICD-10-CM | POA: Diagnosis not present

## 2021-09-13 DIAGNOSIS — Z01411 Encounter for gynecological examination (general) (routine) with abnormal findings: Secondary | ICD-10-CM | POA: Diagnosis not present

## 2021-09-13 DIAGNOSIS — R87810 Cervical high risk human papillomavirus (HPV) DNA test positive: Secondary | ICD-10-CM | POA: Diagnosis not present

## 2021-09-13 DIAGNOSIS — Z1151 Encounter for screening for human papillomavirus (HPV): Secondary | ICD-10-CM | POA: Diagnosis not present

## 2021-09-13 DIAGNOSIS — Z803 Family history of malignant neoplasm of breast: Secondary | ICD-10-CM | POA: Diagnosis not present

## 2021-09-13 DIAGNOSIS — Z0000 Encounter for general adult medical examination without abnormal findings: Secondary | ICD-10-CM | POA: Diagnosis not present

## 2021-09-13 DIAGNOSIS — Z01419 Encounter for gynecological examination (general) (routine) without abnormal findings: Secondary | ICD-10-CM | POA: Diagnosis not present

## 2021-09-13 LAB — HM PAP SMEAR

## 2021-09-24 DIAGNOSIS — Z1231 Encounter for screening mammogram for malignant neoplasm of breast: Secondary | ICD-10-CM | POA: Diagnosis not present

## 2021-10-10 DIAGNOSIS — N6012 Diffuse cystic mastopathy of left breast: Secondary | ICD-10-CM | POA: Diagnosis not present

## 2021-10-10 DIAGNOSIS — Z803 Family history of malignant neoplasm of breast: Secondary | ICD-10-CM | POA: Diagnosis not present

## 2021-10-11 ENCOUNTER — Ambulatory Visit (INDEPENDENT_AMBULATORY_CARE_PROVIDER_SITE_OTHER): Payer: 59 | Admitting: Physician Assistant

## 2021-10-11 ENCOUNTER — Encounter: Payer: Self-pay | Admitting: Physician Assistant

## 2021-10-11 ENCOUNTER — Other Ambulatory Visit (HOSPITAL_BASED_OUTPATIENT_CLINIC_OR_DEPARTMENT_OTHER): Payer: Self-pay

## 2021-10-11 VITALS — BP 131/70 | HR 82 | Ht 63.0 in | Wt 157.0 lb

## 2021-10-11 DIAGNOSIS — R3911 Hesitancy of micturition: Secondary | ICD-10-CM

## 2021-10-11 DIAGNOSIS — K580 Irritable bowel syndrome with diarrhea: Secondary | ICD-10-CM | POA: Diagnosis not present

## 2021-10-11 DIAGNOSIS — Z803 Family history of malignant neoplasm of breast: Secondary | ICD-10-CM | POA: Diagnosis not present

## 2021-10-11 DIAGNOSIS — F3181 Bipolar II disorder: Secondary | ICD-10-CM | POA: Diagnosis not present

## 2021-10-11 DIAGNOSIS — N6321 Unspecified lump in the left breast, upper outer quadrant: Secondary | ICD-10-CM

## 2021-10-11 DIAGNOSIS — F331 Major depressive disorder, recurrent, moderate: Secondary | ICD-10-CM | POA: Diagnosis not present

## 2021-10-11 LAB — POCT URINALYSIS DIP (CLINITEK)
Bilirubin, UA: NEGATIVE
Blood, UA: NEGATIVE
Glucose, UA: NEGATIVE mg/dL
Ketones, POC UA: NEGATIVE mg/dL
Leukocytes, UA: NEGATIVE
Nitrite, UA: NEGATIVE
POC PROTEIN,UA: NEGATIVE
Spec Grav, UA: 1.02 (ref 1.010–1.025)
Urobilinogen, UA: 0.2 E.U./dL
pH, UA: 5.5 (ref 5.0–8.0)

## 2021-10-11 MED ORDER — DICYCLOMINE HCL 10 MG PO CAPS
10.0000 mg | ORAL_CAPSULE | Freq: Three times a day (TID) | ORAL | 1 refills | Status: DC
  Filled 2021-10-11: qty 90, 30d supply, fill #0

## 2021-10-11 MED ORDER — VRAYLAR 4.5 MG PO CAPS
4.5000 mg | ORAL_CAPSULE | Freq: Every day | ORAL | 0 refills | Status: DC
  Filled 2021-10-11: qty 90, 90d supply, fill #0

## 2021-10-11 MED ORDER — DIPHENOXYLATE-ATROPINE 2.5-0.025 MG PO TABS
1.0000 | ORAL_TABLET | Freq: Four times a day (QID) | ORAL | 0 refills | Status: DC | PRN
  Filled 2021-10-11: qty 30, 8d supply, fill #0

## 2021-10-11 NOTE — Patient Instructions (Signed)
Diet for Irritable Bowel Syndrome When you have irritable bowel syndrome (IBS), it is very important to follow the eating habits that are best for your condition. IBS may cause various symptoms, such as pain in the abdomen, constipation, or diarrhea. Choosing the right foods can help to ease the discomfort from these symptoms. Work with your health care provider and dietitian to find the eating plan that will help to control your symptoms. What are tips for following this plan?  Keep a food diary. This will help you identify foods that cause symptoms. Write down: What you eat and when you eat it. What symptoms you have. When symptoms occur in relation to your meals, such as "pain in abdomen 2 hours after dinner." Eat your meals slowly and in a relaxed setting. Aim to eat 5-6 small meals per day. Do not skip meals. Drink enough fluid to keep your urine pale yellow. Ask your health care provider if you should take an over-the-counter probiotic to help restore healthy bacteria in your gut (digestive tract). Probiotics are foods that contain good bacteria and yeasts. Your dietitian may have specific dietary recommendations for you based on your symptoms. Your dietitian may recommend that you: Avoid foods that cause symptoms. Talk with your dietitian about other ways to get the same nutrients that are in those problem foods. Avoid foods with gluten. Gluten is a protein that is found in rye, wheat, and barley. Eat more foods that contain soluble fiber. Examples of foods with high soluble fiber include oats, seeds, and certain fruits and vegetables. Take a fiber supplement if told by your dietitian. Reduce or avoid certain foods called FODMAPs. These are foods that contain sugars that are hard for some people to digest. Ask your health care provider which foods to avoid. What foods should I avoid? The following are some foods and drinks that may make your symptoms worse: Fatty foods, such as french  fries. Foods that contain gluten, such as pasta and cereal. Dairy products, such as milk, cheese, and ice cream. Spicy foods. Alcohol. Products with caffeine, such as coffee, tea, or chocolate. Carbonated drinks, such as soda. Foods that are high in FODMAPs. These include certain fruits and vegetables. Products with sweeteners such as honey, high fructose corn syrup, sorbitol, and mannitol. The items listed above may not be a complete list of foods and beverages you should avoid. Contact a dietitian for more information. What foods are good sources of fiber? Your health care provider or dietitian may recommend that you eat more foods that contain fiber. Fiber can help to reduce constipation and other IBS symptoms. Add foods with fiber to your diet a little at a time so your body can get used to them. Too much fiber at one time might cause gas and swelling of your abdomen. The following are some foods that are good sources of fiber: Berries, such as raspberries, strawberries, and blueberries. Tomatoes. Carrots. Brown rice. Oats. Seeds, such as chia and pumpkin seeds. The items listed above may not be a complete list of recommended sources of fiber. Contact your dietitian for more options. Where to find more information International Foundation for Functional Gastrointestinal Disorders: aboutibs.org National Institute of Diabetes and Digestive and Kidney Diseases: niddk.nih.gov Summary When you have irritable bowel syndrome (IBS), it is very important to follow the eating habits that are best for your condition. IBS may cause various symptoms, such as pain in the abdomen, constipation, or diarrhea. Choosing the right foods can help to ease the   discomfort that comes from symptoms. Your health care provider or dietitian may recommend that you eat more foods that contain fiber. Keep a food diary. This will help you identify foods that cause symptoms. This information is not intended to replace  advice given to you by your health care provider. Make sure you discuss any questions you have with your health care provider. Document Revised: 05/21/2021 Document Reviewed: 05/21/2021 Elsevier Patient Education  2023 Elsevier Inc.  
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
## 2021-10-13 LAB — URINE CULTURE
MICRO NUMBER:: 13299391
SPECIMEN QUALITY:: ADEQUATE

## 2021-10-14 ENCOUNTER — Other Ambulatory Visit (HOSPITAL_BASED_OUTPATIENT_CLINIC_OR_DEPARTMENT_OTHER): Payer: Self-pay

## 2021-10-14 ENCOUNTER — Other Ambulatory Visit: Payer: Self-pay | Admitting: Physician Assistant

## 2021-10-14 MED ORDER — CIPROFLOXACIN HCL 500 MG PO TABS
500.0000 mg | ORAL_TABLET | Freq: Two times a day (BID) | ORAL | 0 refills | Status: AC
  Filled 2021-10-14: qty 6, 3d supply, fill #0

## 2021-10-14 NOTE — Progress Notes (Signed)
[facility] c[facility] to [facility] for urinary tract infection.

## 2021-10-17 ENCOUNTER — Other Ambulatory Visit (HOSPITAL_BASED_OUTPATIENT_CLINIC_OR_DEPARTMENT_OTHER): Payer: Self-pay

## 2021-10-18 ENCOUNTER — Ambulatory Visit: Payer: 59

## 2022-01-03 ENCOUNTER — Other Ambulatory Visit (HOSPITAL_BASED_OUTPATIENT_CLINIC_OR_DEPARTMENT_OTHER): Payer: Self-pay

## 2022-01-17 ENCOUNTER — Ambulatory Visit: Payer: Self-pay

## 2022-01-17 ENCOUNTER — Ambulatory Visit (INDEPENDENT_AMBULATORY_CARE_PROVIDER_SITE_OTHER): Payer: 59 | Admitting: Sports Medicine

## 2022-01-17 DIAGNOSIS — S43015D Anterior dislocation of left humerus, subsequent encounter: Secondary | ICD-10-CM | POA: Diagnosis not present

## 2022-01-17 DIAGNOSIS — S43015S Anterior dislocation of left humerus, sequela: Secondary | ICD-10-CM

## 2022-01-17 NOTE — Assessment & Plan Note (Signed)
Pleasant 41 year old female, history of a shoulder dislocation, she is post arthroscopic repair of the labrum, MR arthrogram at the last visit did show some loss of cartilage in the glenoid. She did have some relief of her pain after the injection. She is having a recurrence of pain, today we did a glenohumeral joint injection, return to see me as needed.

## 2022-01-17 NOTE — Progress Notes (Signed)
    Procedures performed today:    Procedure: Real-time Ultrasound Guided injection of the left glenohumeral joint Device: Samsung HS60  Verbal informed consent obtained.  Time-out conducted.  Noted no overlying erythema, induration, or other signs of local infection.  Skin prepped in a sterile fashion.  Local anesthesia: Topical Ethyl chloride.  With sterile technique and under real time ultrasound guidance: Noted trace effusion, 1 cc Kenalog 40, 2 cc lidocaine, 2 cc bupivacaine injected easily Completed without difficulty  Advised to call if fevers/chills, erythema, induration, drainage, or persistent bleeding.  Images permanently stored and available for review in PACS.  Impression: Technically successful ultrasound guided injection.  Independent interpretation of notes and tests performed by another provider:   None.  Brief History, Exam, Impression, and Recommendations:    Anterior dislocation of left shoulder, status post arthroscopy with glenoid cartilage loss Pleasant 41 year old female, history of a shoulder dislocation, she is post arthroscopic repair of the labrum, MR arthrogram at the last visit did show some loss of cartilage in the glenoid. She did have some relief of her pain after the injection. She is having a recurrence of pain, today we did a glenohumeral joint injection, return to see me as needed.    ____________________________________________ Ihor Austin. Benjamin Stain, M.D., ABFM., CAQSM., AME. Primary Care and Sports Medicine [facility]  Adjunct Professor of Family Medicine  [employer]  Restaurant manager, fast food

## 2022-01-20 ENCOUNTER — Other Ambulatory Visit (HOSPITAL_BASED_OUTPATIENT_CLINIC_OR_DEPARTMENT_OTHER): Payer: Self-pay

## 2022-01-20 DIAGNOSIS — G43719 Chronic migraine without aura, intractable, without status migrainosus: Secondary | ICD-10-CM | POA: Diagnosis not present

## 2022-01-20 MED ORDER — BACLOFEN 10 MG PO TABS
ORAL_TABLET | ORAL | 2 refills | Status: DC
  Filled 2022-01-20: qty 45, 15d supply, fill #0

## 2022-01-20 MED ORDER — RIZATRIPTAN BENZOATE 10 MG PO TABS
ORAL_TABLET | ORAL | 3 refills | Status: DC
  Filled 2022-01-20: qty 9, 30d supply, fill #0

## 2022-01-20 MED ORDER — PROMETHAZINE HCL 25 MG PO TABS
ORAL_TABLET | ORAL | 1 refills | Status: AC
  Filled 2022-01-20: qty 60, 15d supply, fill #0

## 2022-01-20 MED ORDER — ZONISAMIDE 100 MG PO CAPS
ORAL_CAPSULE | ORAL | 6 refills | Status: DC
  Filled 2022-01-20: qty 60, 30d supply, fill #0

## 2022-01-22 ENCOUNTER — Encounter: Payer: Self-pay | Admitting: Neurology

## 2022-01-28 ENCOUNTER — Other Ambulatory Visit (HOSPITAL_BASED_OUTPATIENT_CLINIC_OR_DEPARTMENT_OTHER): Payer: Self-pay

## 2022-02-03 ENCOUNTER — Other Ambulatory Visit (HOSPITAL_BASED_OUTPATIENT_CLINIC_OR_DEPARTMENT_OTHER): Payer: Self-pay

## 2022-02-26 ENCOUNTER — Other Ambulatory Visit: Payer: Self-pay | Admitting: Sports Medicine

## 2022-02-26 ENCOUNTER — Other Ambulatory Visit (HOSPITAL_BASED_OUTPATIENT_CLINIC_OR_DEPARTMENT_OTHER): Payer: Self-pay

## 2022-02-26 ENCOUNTER — Encounter (INDEPENDENT_AMBULATORY_CARE_PROVIDER_SITE_OTHER): Payer: 59 | Admitting: Sports Medicine

## 2022-02-26 DIAGNOSIS — S93492A Sprain of other ligament of left ankle, initial encounter: Secondary | ICD-10-CM

## 2022-02-27 ENCOUNTER — Other Ambulatory Visit (HOSPITAL_BASED_OUTPATIENT_CLINIC_OR_DEPARTMENT_OTHER): Payer: Self-pay

## 2022-02-27 MED ORDER — OXYCODONE-ACETAMINOPHEN 5-325 MG PO TABS
1.0000 | ORAL_TABLET | Freq: Three times a day (TID) | ORAL | 0 refills | Status: DC | PRN
  Filled 2022-02-27: qty 30, 10d supply, fill #0

## 2022-02-27 NOTE — Telephone Encounter (Signed)
I spent 5 total minutes of online digital evaluation and management services in this patient-initiated request for online care. 

## 2022-03-04 ENCOUNTER — Other Ambulatory Visit (HOSPITAL_COMMUNITY): Payer: Self-pay

## 2022-03-04 MED ORDER — BOTOX 200 UNITS IJ SOLR
INTRAMUSCULAR | 3 refills | Status: DC
  Filled 2022-03-12: qty 1, 84d supply, fill #0

## 2022-03-05 ENCOUNTER — Other Ambulatory Visit (HOSPITAL_BASED_OUTPATIENT_CLINIC_OR_DEPARTMENT_OTHER): Payer: Self-pay

## 2022-03-12 ENCOUNTER — Other Ambulatory Visit (HOSPITAL_COMMUNITY): Payer: Self-pay

## 2022-03-13 ENCOUNTER — Other Ambulatory Visit (HOSPITAL_COMMUNITY): Payer: Self-pay

## 2022-03-17 ENCOUNTER — Other Ambulatory Visit (HOSPITAL_COMMUNITY): Payer: Self-pay

## 2022-03-19 DIAGNOSIS — G43719 Chronic migraine without aura, intractable, without status migrainosus: Secondary | ICD-10-CM | POA: Diagnosis not present

## 2022-04-24 ENCOUNTER — Other Ambulatory Visit (HOSPITAL_COMMUNITY): Payer: Self-pay

## 2022-04-24 DIAGNOSIS — R928 Other abnormal and inconclusive findings on diagnostic imaging of breast: Secondary | ICD-10-CM | POA: Diagnosis not present

## 2022-05-02 ENCOUNTER — Other Ambulatory Visit: Payer: Self-pay

## 2022-05-02 ENCOUNTER — Encounter (HOSPITAL_COMMUNITY): Payer: Self-pay

## 2022-05-02 ENCOUNTER — Emergency Department (HOSPITAL_COMMUNITY)
Admission: EM | Admit: 2022-05-02 | Discharge: 2022-05-02 | Disposition: A | Discharge status: Home / Self Care | Payer: 59 | Attending: Emergency Medicine | Admitting: Emergency Medicine

## 2022-05-02 ENCOUNTER — Emergency Department (HOSPITAL_COMMUNITY): Payer: 59

## 2022-05-02 DIAGNOSIS — W231XXA Caught, crushed, jammed, or pinched between stationary objects, initial encounter: Secondary | ICD-10-CM | POA: Insufficient documentation

## 2022-05-02 DIAGNOSIS — Y990 Civilian activity done for income or pay: Secondary | ICD-10-CM | POA: Diagnosis not present

## 2022-05-02 DIAGNOSIS — S65302A Unspecified injury of deep palmar arch of left hand, initial encounter: Secondary | ICD-10-CM | POA: Diagnosis present

## 2022-05-02 DIAGNOSIS — S60222A Contusion of left hand, initial encounter: Secondary | ICD-10-CM | POA: Insufficient documentation

## 2022-05-02 NOTE — ED Provider Notes (Signed)
[facility]-EMERGENCY DEPT Provider Note   CSN: [id number] Arrival date & time: 05/02/22  1017     History  Chief Complaint  Patient presents with   Hand Pain    Gwendolyn Bowen is a 41 y.o. female here with left hand pain, crushed between cart and pole at work today.  No other injuries.  HPI     Home Medications Prior to Admission medications   Medication Sig Start Date End Date Taking? Authorizing Provider  baclofen (LIORESAL) 10 MG tablet Take one tablet (10 mg dose) by mouth 3 (three) times a day as needed (headache). 06/03/21     baclofen (LIORESAL) 10 MG tablet Take one tablet (10 mg dose) by mouth 3 (three) times a day as needed for headache. 01/20/22     botulinum toxin Type A (BOTOX) 200 units injection Injection up to 200 units IM by provider to head, neck, and shoulders q84 days, discard any unused portion 03/03/22     Cariprazine HCl (VRAYLAR) 4.5 MG CAPS Take 1 capsule (4.5 mg total) by mouth daily. 10/11/21   Breeback, Lonna Cobb, PA-C  celecoxib (CELEBREX) 200 MG capsule Take 1 to 2 tablets by mouth daily as needed for pain. 07/05/21   Monica Becton, MD  clomiPRAMINE (ANAFRANIL) 25 MG capsule Take by mouth. 08/04/17   [provider]  clonazePAM (KLONOPIN) 0.5 MG tablet Take 1 tablet (0.5 mg total) by mouth 2 (two) times daily as needed for anxiety. 06/04/21   Breeback, Lonna Cobb, PA-C  dicyclomine (BENTYL) 10 MG capsule Take 1 capsule (10 mg total) by mouth 3 (three) times daily before meals. 10/11/21   Breeback, Lonna Cobb, PA-C  diphenoxylate-atropine (LOMOTIL) 2.5-0.025 MG tablet Take 1 tablet by mouth 4 (four) times daily as needed for diarrhea or loose stools. 10/11/21   Breeback, Jade L, PA-C  EPINEPHrine 0.3 mg/0.3 mL IJ SOAJ injection Inject into the muscle. 01/25/14   [provider]  Erenumab-aooe (AIMOVIG) 140 MG/ML SOAJ Inject 140 mg into the skin every 30 (thirty) days. 06/03/21     FLUoxetine (PROZAC) 40 MG capsule Take by  mouth. 11/30/19   [provider]  gabapentin (NEURONTIN) 100 MG capsule Take by mouth.    [provider]  oxyCODONE-acetaminophen (PERCOCET/ROXICET) 5-325 MG tablet Take 1 tablet by mouth every 8 (eight) hours as needed. 02/27/22   Monica Becton, MD  progesterone (PROMETRIUM) 100 MG capsule Take 2 capsules (200 mg total) by mouth at bedtime. 02/12/21     promethazine (PHENERGAN) 25 MG tablet Take 1/2 - 1 tablet by mouth every 6 hours as needed for nausea 06/03/21     promethazine (PHENERGAN) 25 MG tablet Take 1/2 to 1 tablet (12.5-25 mg dose) by mouth every 6 (six) hours as needed for nausea. 01/20/22     Rimegepant Sulfate (NURTEC) 75 MG TBDP Take by mouth. 03/12/20   [provider]  rizatriptan (MAXALT) 10 MG tablet Take one tablet (10 mg dose) by mouth once as needed for Migraine for up to 1 dose. May repeat in 2 hours if needed.  Max of 2 tablets per 24 hours 06/03/21     rizatriptan (MAXALT) 10 MG tablet Take one tablet (10 mg dose) by mouth once as needed for migraine for up to 1 dose. May repeat in 2 hours if needed.  Max 2/24 hours 01/20/22     zonisamide (ZONEGRAN) 100 MG capsule Take one capsule (100 mg dose) by mouth 2 (two) times daily. 01/20/22  Allergies    Codeine, Nsaids, Aleve [naproxen sodium], Amoxicillin, Asa [aspirin], Ibuprofen, Penicillins, Sulfa antibiotics, and Gabapentin    Review of Systems   Review of Systems  Physical Exam Updated Vital Signs BP 107/67 (BP Location: Right Arm)   Pulse 90   Temp 98.4 F (36.9 C) (Oral)   Resp 16   Ht 5\' 3"  (1.6 m)   Wt 67.1 kg   LMP 04/27/2014   SpO2 100%   BMI 26.22 kg/m  Physical Exam Constitutional:      General: She is not in acute distress. HENT:     Head: Normocephalic and atraumatic.  Eyes:     Conjunctiva/sclera: Conjunctivae normal.     Pupils: Pupils are equal, round, and reactive to light.  Cardiovascular:     Rate and Rhythm: Normal rate and regular rhythm.   Pulmonary:     Effort: Pulmonary effort is normal. No respiratory distress.  Abdominal:     General: There is no distension.     Tenderness: There is no abdominal tenderness.  Musculoskeletal:     Comments: TTP of the 2nd and 3rd PIP of the left hand, no visible deformities, patient able to actively and passively perform finger flexion and extension  Skin:    General: Skin is warm and dry.  Neurological:     General: No focal deficit present.     Mental Status: She is alert and oriented to person, place, and time. Mental status is at baseline.  Psychiatric:        Mood and Affect: Mood normal.        Behavior: Behavior normal.     ED Results / Procedures / Treatments   Labs (all labs ordered are listed, but only abnormal results are displayed) Labs Reviewed - No data to display  EKG None  Radiology DG Hand Complete Left  Result Date: 05/02/2022 CLINICAL DATA:  Crush injury. EXAM: LEFT HAND - COMPLETE 3+ VIEW COMPARISON:  None Available. FINDINGS: There is no evidence of fracture or dislocation. There is no evidence of arthropathy or other focal bone abnormality. Soft tissues are unremarkable. IMPRESSION: Negative. Electronically Signed   By: 13/03/2022 M.D.   On: 05/02/2022 11:13    Procedures Procedures    Medications Ordered in ED Medications - No data to display  ED Course/ Medical Decision Making/ A&P                           Medical Decision Making Amount and/or Complexity of Data Reviewed Radiology: ordered.   Isolated wrist injury, x-rays ordered and personally viewed interpreted, no fracture.  No evidence of tendon injury.  I doubt compartment syndrome.  He likely has a contusion.  Work note provided.  Okay for discharge        Final Clinical Impression(s) / ED Diagnoses Final diagnoses:  Contusion of left hand, initial encounter    Rx / DC Orders ED Discharge Orders     None         13/03/2022, MD 05/02/22 1339

## 2022-05-02 NOTE — ED Triage Notes (Signed)
Left hand pain pt states smashed hand between cart and poll.

## 2022-05-02 NOTE — Progress Notes (Signed)
Orthopedic Tech Progress Note Patient Details:  LINNIE DELGRANDE 1980/10/11 [id number] Combination of a standard and dorsal volar were applied to stop finger flexion at the pip joint per Dr Salena Saner request. Padding was also applied underneath for comfort while in splint.  Ortho Devices Type of Ortho Device: Volar splint Ortho Device/Splint Location: Left hand Ortho Device/Splint Interventions: Application   Post Interventions Patient Tolerated: Well  Genelle Bal Emari Demmer 05/02/2022, 12:05 PM

## 2022-05-02 NOTE — Discharge Instructions (Addendum)
  Because of your contusion or crush injury to your hand, you may not be able to use your left hand for normal work activities. Likely the pain and symptoms would improve over the next week, therefore I gave you a work note for 1 week.  If you are not getting better after 7 days, despite Tylenol and rest at home, you may need a repeat x-ray or a hand surgeon evaluation.

## 2022-05-05 ENCOUNTER — Other Ambulatory Visit (HOSPITAL_BASED_OUTPATIENT_CLINIC_OR_DEPARTMENT_OTHER): Payer: Self-pay

## 2022-05-05 DIAGNOSIS — M79642 Pain in left hand: Secondary | ICD-10-CM | POA: Diagnosis not present

## 2022-05-06 ENCOUNTER — Ambulatory Visit: Payer: 59 | Admitting: Physician Assistant

## 2022-05-09 ENCOUNTER — Ambulatory Visit: Payer: 59 | Admitting: Physician Assistant

## 2022-05-21 ENCOUNTER — Ambulatory Visit: Payer: 59 | Admitting: Physician Assistant

## 2022-05-23 ENCOUNTER — Telehealth: Payer: Self-pay | Admitting: Neurology

## 2022-05-23 NOTE — Telephone Encounter (Signed)
Received FMLA forms via fax on patient.   She will need an appt to discuss. Please call to schedule. Thanks!

## 2022-05-23 NOTE — Telephone Encounter (Signed)
Patient has a appointment for 06/04/22 @8 :30 tvt

## 2022-05-28 ENCOUNTER — Telehealth: Payer: Self-pay

## 2022-05-28 NOTE — Telephone Encounter (Signed)
We received a call from [facility] regarding a referral that was sent over to our office for a transfer of care from [place]. Her current neurologist, Anastasia Fiedler NP has changed offices and specialties and this patient is requesting to come back to our office since [facility] can not get her scheduled for a few months. I advised the patient that she was dismissed d/t no shows in 2019, and she advised that only one of those visits should have been a no show, and that she had called advising she was going to be late on one of them.  She is asking that the dismissal be reviewed and changed so that she can establish care with our office.  Please advise

## 2022-06-04 ENCOUNTER — Other Ambulatory Visit (HOSPITAL_BASED_OUTPATIENT_CLINIC_OR_DEPARTMENT_OTHER): Payer: Self-pay

## 2022-06-04 ENCOUNTER — Ambulatory Visit (INDEPENDENT_AMBULATORY_CARE_PROVIDER_SITE_OTHER): Payer: 59 | Admitting: Physician Assistant

## 2022-06-04 VITALS — BP 115/53 | HR 80 | Ht 63.0 in | Wt 148.0 lb

## 2022-06-04 DIAGNOSIS — K580 Irritable bowel syndrome with diarrhea: Secondary | ICD-10-CM

## 2022-06-04 DIAGNOSIS — F331 Major depressive disorder, recurrent, moderate: Secondary | ICD-10-CM | POA: Diagnosis not present

## 2022-06-04 DIAGNOSIS — F3181 Bipolar II disorder: Secondary | ICD-10-CM | POA: Diagnosis not present

## 2022-06-04 DIAGNOSIS — G43109 Migraine with aura, not intractable, without status migrainosus: Secondary | ICD-10-CM | POA: Diagnosis not present

## 2022-06-04 MED ORDER — ZONISAMIDE 100 MG PO CAPS
100.0000 mg | ORAL_CAPSULE | Freq: Two times a day (BID) | ORAL | 3 refills | Status: DC
  Filled 2022-06-04: qty 180, 90d supply, fill #0

## 2022-06-04 MED ORDER — RIZATRIPTAN BENZOATE 10 MG PO TABS
ORAL_TABLET | ORAL | 3 refills | Status: DC
  Filled 2022-06-04: qty 9, 30d supply, fill #0
  Filled 2022-09-18: qty 9, 30d supply, fill #1
  Filled 2022-11-24 – 2022-11-28 (×2): qty 9, 30d supply, fill #2

## 2022-06-04 MED ORDER — FLUOXETINE HCL 40 MG PO CAPS
40.0000 mg | ORAL_CAPSULE | Freq: Every day | ORAL | 3 refills | Status: DC
  Filled 2022-06-04: qty 90, 90d supply, fill #0

## 2022-06-04 MED ORDER — PROGESTERONE MICRONIZED 100 MG PO CAPS
200.0000 mg | ORAL_CAPSULE | Freq: Every day | ORAL | 3 refills | Status: DC
  Filled 2022-06-04: qty 180, 90d supply, fill #0

## 2022-06-04 MED ORDER — VRAYLAR 4.5 MG PO CAPS
4.5000 mg | ORAL_CAPSULE | Freq: Every day | ORAL | 0 refills | Status: DC
  Filled 2022-06-04: qty 90, 90d supply, fill #0

## 2022-06-04 MED ORDER — NURTEC 75 MG PO TBDP
75.0000 mg | ORAL_TABLET | ORAL | 3 refills | Status: DC
  Filled 2022-06-04: qty 48, 90d supply, fill #0

## 2022-06-04 MED ORDER — BACLOFEN 10 MG PO TABS
10.0000 mg | ORAL_TABLET | Freq: Three times a day (TID) | ORAL | 2 refills | Status: DC | PRN
  Filled 2022-06-04: qty 45, 15d supply, fill #0

## 2022-06-04 MED ORDER — DICYCLOMINE HCL 10 MG PO CAPS
10.0000 mg | ORAL_CAPSULE | Freq: Three times a day (TID) | ORAL | 5 refills | Status: DC
  Filled 2022-06-04: qty 90, 30d supply, fill #0
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
## 2022-06-04 NOTE — Patient Instructions (Signed)
Will refer to neurology for botox injections she may have to go [facility].  Will refill medications.

## 2022-06-05 ENCOUNTER — Other Ambulatory Visit (HOSPITAL_BASED_OUTPATIENT_CLINIC_OR_DEPARTMENT_OTHER): Payer: Self-pay

## 2022-06-05 ENCOUNTER — Other Ambulatory Visit (HOSPITAL_COMMUNITY): Payer: Self-pay

## 2022-06-09 ENCOUNTER — Other Ambulatory Visit (HOSPITAL_BASED_OUTPATIENT_CLINIC_OR_DEPARTMENT_OTHER): Payer: Self-pay

## 2022-06-09 ENCOUNTER — Encounter: Payer: Self-pay | Admitting: Physician Assistant

## 2022-06-09 ENCOUNTER — Telehealth: Payer: Self-pay | Admitting: Neurology

## 2022-06-09 ENCOUNTER — Other Ambulatory Visit (HOSPITAL_COMMUNITY): Payer: Self-pay

## 2022-06-09 NOTE — Telephone Encounter (Signed)
I called to verify with patient how far away from home she is willing to drive before sending. Referral has been sent, pending fax confirmation first but will notify patient when that comes through. Gwendolyn Bowen

## 2022-06-09 NOTE — Telephone Encounter (Signed)
Can you let patient know? Thanks.

## 2022-06-09 NOTE — Telephone Encounter (Signed)
Called patient to let her know that the paperwork was completed and the cost is $29. Tvt

## 2022-06-09 NOTE — Telephone Encounter (Signed)
Forms completed, copy sent to scan, copy to hold. Given to front desk to collect payment.  (Front desk FYI)  Gwendolyn Bowen - Patient also left a vm checking on Neurology referral for Botox. Can we follow up with patient?

## 2022-06-09 NOTE — Telephone Encounter (Signed)
Have called around to practices in [place], [place], [place], and [place]. Have sent to [facility] with an estimated schedule date of second week of January, as most other practices are scheduling into April. Katha Hamming

## 2022-06-10 ENCOUNTER — Other Ambulatory Visit (HOSPITAL_BASED_OUTPATIENT_CLINIC_OR_DEPARTMENT_OTHER): Payer: Self-pay

## 2022-06-11 ENCOUNTER — Other Ambulatory Visit (HOSPITAL_BASED_OUTPATIENT_CLINIC_OR_DEPARTMENT_OTHER): Payer: Self-pay

## 2022-06-13 ENCOUNTER — Other Ambulatory Visit: Payer: Self-pay

## 2022-06-17 ENCOUNTER — Other Ambulatory Visit (HOSPITAL_BASED_OUTPATIENT_CLINIC_OR_DEPARTMENT_OTHER): Payer: Self-pay

## 2022-06-18 ENCOUNTER — Other Ambulatory Visit (HOSPITAL_BASED_OUTPATIENT_CLINIC_OR_DEPARTMENT_OTHER): Payer: Self-pay

## 2022-06-18 ENCOUNTER — Other Ambulatory Visit (HOSPITAL_COMMUNITY): Payer: Self-pay

## 2022-06-19 ENCOUNTER — Other Ambulatory Visit (HOSPITAL_BASED_OUTPATIENT_CLINIC_OR_DEPARTMENT_OTHER): Payer: Self-pay

## 2022-06-20 ENCOUNTER — Other Ambulatory Visit (HOSPITAL_BASED_OUTPATIENT_CLINIC_OR_DEPARTMENT_OTHER): Payer: Self-pay

## 2022-06-20 ENCOUNTER — Telehealth: Payer: Self-pay

## 2022-06-20 NOTE — Telephone Encounter (Signed)
Initiated Prior authorization [id number] 75MG  dispersible tablets Via: Covermymeds Case/Key:[id number]  Status: Pending as of 412/29/23 Reason: Notified Pt via: Mychart

## 2022-06-22 ENCOUNTER — Other Ambulatory Visit: Payer: Self-pay

## 2022-06-22 ENCOUNTER — Ambulatory Visit
Admission: RE | Admit: 2022-06-22 | Discharge: 2022-06-22 | Disposition: A | Discharge status: Home / Self Care | Payer: 59 | Source: Ambulatory Visit | Attending: Physician Assistant | Admitting: Physician Assistant

## 2022-06-22 VITALS — BP 111/77 | HR 73 | Temp 98.3°F | Resp 16

## 2022-06-22 DIAGNOSIS — J40 Bronchitis, not specified as acute or chronic: Secondary | ICD-10-CM | POA: Diagnosis not present

## 2022-06-22 DIAGNOSIS — J4521 Mild intermittent asthma with (acute) exacerbation: Secondary | ICD-10-CM | POA: Diagnosis not present

## 2022-06-22 DIAGNOSIS — J329 Chronic sinusitis, unspecified: Secondary | ICD-10-CM | POA: Diagnosis not present

## 2022-06-22 LAB — POC SARS CORONAVIRUS 2 AG -  ED: SARS Coronavirus 2 Ag: NEGATIVE

## 2022-06-22 MED ORDER — ALBUTEROL SULFATE HFA 108 (90 BASE) MCG/ACT IN AERS: 1.0000 | INHALATION_SPRAY | Freq: Four times a day (QID) | RESPIRATORY_TRACT | 0 refills | Status: AC | PRN

## 2022-06-22 MED ORDER — PREDNISONE 10 MG (21) PO TBPK: ORAL_TABLET | ORAL | 0 refills | Status: DC

## 2022-06-22 MED ORDER — DOXYCYCLINE HYCLATE 100 MG PO CAPS
100.0000 mg | ORAL_CAPSULE | Freq: Two times a day (BID) | ORAL | 0 refills | Status: DC
Start: 2022-06-22 — End: 2023-01-07

## 2022-06-22 MED ORDER — PROMETHAZINE-DM 6.25-15 MG/5ML PO SYRP: 5.0000 mL | ORAL_SOLUTION | Freq: Three times a day (TID) | ORAL | 0 refills | Status: DC | PRN
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
## 2022-06-22 NOTE — ED Triage Notes (Signed)
Patient presents to Urgent Care with complaints of hoarseness, sore throat, nasal congestion, chills since 1 week ago. Patient reports daughter recently had covid. Taking Mucinex, Alka Seltzer Plus. Denies any fever

## 2022-06-22 NOTE — Discharge Instructions (Signed)
You tested negative for COVID-19.  I am concerned that you have a sinus/bronchitis infection.  Start doxycycline 100 mg twice daily.  Stay out of the sun while on this medication.  Start prednisone taper.  Do not take NSAIDs with this medication including aspirin, ibuprofen/Advil, naproxen/Aleve.  Use Promethazine DM for cough.  This will make you sleepy so do not drive or drink alcohol while taking it.  I have called in a refill of your albuterol inhaler.  Use over-the-counter medications including Mucinex, Flonase, Tylenol.  If your symptoms or not improving or if anything worsens you need to be seen immediately.

## 2022-06-24 ENCOUNTER — Other Ambulatory Visit (HOSPITAL_BASED_OUTPATIENT_CLINIC_OR_DEPARTMENT_OTHER): Payer: Self-pay

## 2022-07-02 NOTE — Telephone Encounter (Signed)
Pt. Came into the office to pick up FMLA paperwork, patient advised cost is $29 for completed forms. Pt. Stated she will come back next week to pay when she gets paid. Pt. Also advised forms can be faxed when fee has been paid.

## 2022-07-03 ENCOUNTER — Other Ambulatory Visit (HOSPITAL_BASED_OUTPATIENT_CLINIC_OR_DEPARTMENT_OTHER): Payer: Self-pay

## 2022-07-04 ENCOUNTER — Other Ambulatory Visit (HOSPITAL_COMMUNITY): Payer: Self-pay

## 2022-08-21 DIAGNOSIS — G43101 Migraine with aura, not intractable, with status migrainosus: Secondary | ICD-10-CM | POA: Diagnosis not present

## 2022-08-21 DIAGNOSIS — F3181 Bipolar II disorder: Secondary | ICD-10-CM | POA: Diagnosis not present

## 2022-08-21 DIAGNOSIS — K589 Irritable bowel syndrome without diarrhea: Secondary | ICD-10-CM | POA: Diagnosis not present

## 2022-08-21 DIAGNOSIS — F419 Anxiety disorder, unspecified: Secondary | ICD-10-CM | POA: Diagnosis not present

## 2022-09-03 ENCOUNTER — Ambulatory Visit: Payer: Medicaid Other | Admitting: Physician Assistant

## 2022-09-18 ENCOUNTER — Other Ambulatory Visit (HOSPITAL_BASED_OUTPATIENT_CLINIC_OR_DEPARTMENT_OTHER): Payer: Self-pay

## 2022-10-01 ENCOUNTER — Other Ambulatory Visit: Payer: Self-pay

## 2022-10-01 ENCOUNTER — Other Ambulatory Visit (HOSPITAL_COMMUNITY): Payer: Self-pay

## 2022-10-01 MED ORDER — BOTOX 200 UNITS IJ SOLR
INTRAMUSCULAR | 2 refills | Status: DC
Start: 2022-10-01 — End: 2023-08-21
  Filled 2022-10-03: qty 1, 90d supply, fill #0
  Filled 2022-10-07: qty 1, 84d supply, fill #0

## 2022-10-02 ENCOUNTER — Other Ambulatory Visit (HOSPITAL_COMMUNITY): Payer: Self-pay

## 2022-10-03 ENCOUNTER — Other Ambulatory Visit: Payer: Self-pay

## 2022-10-03 ENCOUNTER — Other Ambulatory Visit (HOSPITAL_COMMUNITY): Payer: Self-pay

## 2022-10-06 ENCOUNTER — Other Ambulatory Visit: Payer: Self-pay

## 2022-10-07 ENCOUNTER — Other Ambulatory Visit: Payer: Self-pay

## 2022-10-07 ENCOUNTER — Other Ambulatory Visit (HOSPITAL_COMMUNITY): Payer: Self-pay

## 2022-10-08 ENCOUNTER — Other Ambulatory Visit: Payer: Self-pay

## 2022-10-08 ENCOUNTER — Other Ambulatory Visit (HOSPITAL_COMMUNITY): Payer: Self-pay

## 2022-10-08 DIAGNOSIS — G43711 Chronic migraine without aura, intractable, with status migrainosus: Secondary | ICD-10-CM | POA: Diagnosis not present
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
## 2022-10-21 ENCOUNTER — Ambulatory Visit: Payer: Medicaid Other | Admitting: Neurology

## 2022-10-21 ENCOUNTER — Encounter: Payer: Self-pay | Admitting: Neurology

## 2022-11-13 DIAGNOSIS — Z01419 Encounter for gynecological examination (general) (routine) without abnormal findings: Secondary | ICD-10-CM | POA: Diagnosis not present

## 2022-11-24 ENCOUNTER — Other Ambulatory Visit (HOSPITAL_COMMUNITY): Payer: Self-pay

## 2022-11-28 ENCOUNTER — Other Ambulatory Visit (HOSPITAL_COMMUNITY): Payer: Self-pay

## 2022-12-08 ENCOUNTER — Other Ambulatory Visit (HOSPITAL_COMMUNITY): Payer: Self-pay

## 2022-12-08 ENCOUNTER — Other Ambulatory Visit: Payer: Self-pay

## 2022-12-08 MED ORDER — BOTOX 200 UNITS IJ SOLR
INTRAMUSCULAR | 2 refills | Status: DC
  Filled 2022-12-16: qty 1, 84d supply, fill #0
  Filled 2023-03-24: qty 1, 84d supply, fill #1

## 2022-12-15 ENCOUNTER — Other Ambulatory Visit (HOSPITAL_COMMUNITY): Payer: Self-pay

## 2022-12-16 ENCOUNTER — Other Ambulatory Visit (HOSPITAL_COMMUNITY): Payer: Self-pay

## 2022-12-17 ENCOUNTER — Other Ambulatory Visit (HOSPITAL_COMMUNITY): Payer: Self-pay

## 2022-12-29 ENCOUNTER — Other Ambulatory Visit (HOSPITAL_COMMUNITY): Payer: Self-pay

## 2022-12-30 ENCOUNTER — Other Ambulatory Visit: Payer: Self-pay

## 2022-12-31 ENCOUNTER — Ambulatory Visit: Payer: Commercial Managed Care - PPO | Admitting: Physician Assistant

## 2023-01-07 ENCOUNTER — Encounter: Payer: Self-pay | Admitting: Physician Assistant

## 2023-01-07 ENCOUNTER — Ambulatory Visit (INDEPENDENT_AMBULATORY_CARE_PROVIDER_SITE_OTHER): Payer: Commercial Managed Care - PPO | Admitting: Physician Assistant

## 2023-01-07 ENCOUNTER — Other Ambulatory Visit (HOSPITAL_BASED_OUTPATIENT_CLINIC_OR_DEPARTMENT_OTHER): Payer: Self-pay

## 2023-01-07 ENCOUNTER — Other Ambulatory Visit: Payer: Self-pay

## 2023-01-07 DIAGNOSIS — F5101 Primary insomnia: Secondary | ICD-10-CM | POA: Diagnosis not present

## 2023-01-07 DIAGNOSIS — G43109 Migraine with aura, not intractable, without status migrainosus: Secondary | ICD-10-CM | POA: Diagnosis not present

## 2023-01-07 DIAGNOSIS — F3181 Bipolar II disorder: Secondary | ICD-10-CM | POA: Diagnosis not present

## 2023-01-07 DIAGNOSIS — F4312 Post-traumatic stress disorder, chronic: Secondary | ICD-10-CM | POA: Diagnosis not present

## 2023-01-07 DIAGNOSIS — F331 Major depressive disorder, recurrent, moderate: Secondary | ICD-10-CM

## 2023-01-07 DIAGNOSIS — G43719 Chronic migraine without aura, intractable, without status migrainosus: Secondary | ICD-10-CM | POA: Diagnosis not present

## 2023-01-07 MED ORDER — RIZATRIPTAN BENZOATE 10 MG PO TABS
ORAL_TABLET | ORAL | 5 refills | Status: DC
Start: 2023-01-07 — End: 2023-10-09
  Filled 2023-01-07: qty 15, 30d supply, fill #0
  Filled 2023-03-17: qty 15, 30d supply, fill #1
  Filled 2023-05-14: qty 15, 30d supply, fill #2
  Filled 2023-08-07: qty 9, 10d supply, fill #3

## 2023-01-07 MED ORDER — PROGESTERONE MICRONIZED 100 MG PO CAPS
200.0000 mg | ORAL_CAPSULE | Freq: Every day | ORAL | 3 refills | Status: AC
Start: 2023-01-07 — End: ?
  Filled 2023-01-07: qty 180, 90d supply, fill #0

## 2023-01-07 MED ORDER — CLONAZEPAM 0.5 MG PO TABS
0.5000 mg | ORAL_TABLET | Freq: Two times a day (BID) | ORAL | 2 refills | Status: AC | PRN
Start: 2023-01-07 — End: ?
  Filled 2023-01-07: qty 60, 30d supply, fill #0

## 2023-01-07 MED ORDER — BACLOFEN 10 MG PO TABS
10.0000 mg | ORAL_TABLET | Freq: Three times a day (TID) | ORAL | 3 refills | Status: AC | PRN
Start: 2023-01-07 — End: ?
  Filled 2023-01-07: qty 90, 30d supply, fill #0

## 2023-01-07 MED ORDER — CARIPRAZINE HCL 3 MG PO CAPS
3.0000 mg | ORAL_CAPSULE | Freq: Every day | ORAL | 2 refills | Status: DC
Start: 2023-01-07 — End: 2023-08-21
  Filled 2023-01-07: qty 30, 30d supply, fill #0

## 2023-01-07 MED ORDER — CARIPRAZINE HCL 1.5 MG PO CAPS
1.5000 mg | ORAL_CAPSULE | Freq: Every day | ORAL | 0 refills | Status: DC
Start: 2023-01-07 — End: 2023-08-21
  Filled 2023-01-07: qty 30, 30d supply, fill #0

## 2023-01-07 MED ORDER — ZONISAMIDE 100 MG PO CAPS
100.0000 mg | ORAL_CAPSULE | Freq: Two times a day (BID) | ORAL | 3 refills | Status: AC
Start: 2023-01-07 — End: ?
  Filled 2023-01-07: qty 180, 90d supply, fill #0

## 2023-01-07 MED ORDER — TRAZODONE HCL 50 MG PO TABS
25.0000 mg | ORAL_TABLET | Freq: Every evening | ORAL | 2 refills | Status: AC | PRN
Start: 2023-01-07 — End: ?
  Filled 2023-01-07: qty 30, 30d supply, fill #0

## 2023-01-07 MED ORDER — FLUOXETINE HCL 40 MG PO CAPS
40.0000 mg | ORAL_CAPSULE | Freq: Every day | ORAL | 3 refills | Status: DC
Start: 2023-01-07 — End: 2023-08-21
  Filled 2023-01-07: qty 90, 90d supply, fill #0
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
## 2023-01-08 ENCOUNTER — Other Ambulatory Visit (HOSPITAL_BASED_OUTPATIENT_CLINIC_OR_DEPARTMENT_OTHER): Payer: Self-pay

## 2023-01-12 ENCOUNTER — Encounter: Payer: Self-pay | Admitting: Physician Assistant

## 2023-01-12 DIAGNOSIS — F5101 Primary insomnia: Secondary | ICD-10-CM | POA: Insufficient documentation

## 2023-01-16 ENCOUNTER — Other Ambulatory Visit (HOSPITAL_BASED_OUTPATIENT_CLINIC_OR_DEPARTMENT_OTHER): Payer: Self-pay

## 2023-01-19 ENCOUNTER — Other Ambulatory Visit (HOSPITAL_BASED_OUTPATIENT_CLINIC_OR_DEPARTMENT_OTHER): Payer: Self-pay

## 2023-01-21 ENCOUNTER — Other Ambulatory Visit (HOSPITAL_BASED_OUTPATIENT_CLINIC_OR_DEPARTMENT_OTHER): Payer: Self-pay

## 2023-01-22 ENCOUNTER — Other Ambulatory Visit (HOSPITAL_BASED_OUTPATIENT_CLINIC_OR_DEPARTMENT_OTHER): Payer: Self-pay

## 2023-01-27 ENCOUNTER — Other Ambulatory Visit (HOSPITAL_BASED_OUTPATIENT_CLINIC_OR_DEPARTMENT_OTHER): Payer: Self-pay

## 2023-03-14 ENCOUNTER — Encounter (HOSPITAL_COMMUNITY): Payer: Self-pay

## 2023-03-17 ENCOUNTER — Other Ambulatory Visit (HOSPITAL_COMMUNITY): Payer: Self-pay

## 2023-03-17 ENCOUNTER — Other Ambulatory Visit (HOSPITAL_BASED_OUTPATIENT_CLINIC_OR_DEPARTMENT_OTHER): Payer: Self-pay

## 2023-03-23 ENCOUNTER — Other Ambulatory Visit: Payer: Self-pay

## 2023-03-24 ENCOUNTER — Other Ambulatory Visit (HOSPITAL_COMMUNITY): Payer: Self-pay

## 2023-03-24 ENCOUNTER — Other Ambulatory Visit: Payer: Self-pay

## 2023-03-24 ENCOUNTER — Encounter (HOSPITAL_COMMUNITY): Payer: Self-pay

## 2023-03-24 NOTE — Progress Notes (Signed)
Specialty Pharmacy Refill Coordination Note  Gwendolyn Bowen is a 42 y.o. female contacted today regarding refills of specialty medication(s) Onabotulinumtoxina   Patient requested Delivery   Delivery date: 03/26/23   Verified address: [name], [address], [name]   Medication will be filled on as soon as possible *Pending Prior Auth*.

## 2023-03-25 ENCOUNTER — Other Ambulatory Visit: Payer: Self-pay

## 2023-03-27 DIAGNOSIS — G43719 Chronic migraine without aura, intractable, without status migrainosus: Secondary | ICD-10-CM | POA: Diagnosis not present

## 2023-03-31 ENCOUNTER — Other Ambulatory Visit (HOSPITAL_COMMUNITY): Payer: Self-pay

## 2023-03-31 ENCOUNTER — Other Ambulatory Visit: Payer: Self-pay

## 2023-03-31 MED ORDER — ZONISAMIDE 100 MG PO CAPS
ORAL_CAPSULE | ORAL | 0 refills | Status: AC
  Filled 2023-03-31: qty 90, 30d supply, fill #0

## 2023-04-10 ENCOUNTER — Other Ambulatory Visit (HOSPITAL_COMMUNITY): Payer: Self-pay

## 2023-04-24 DIAGNOSIS — J4520 Mild intermittent asthma, uncomplicated: Secondary | ICD-10-CM | POA: Diagnosis not present

## 2023-04-24 DIAGNOSIS — G43709 Chronic migraine without aura, not intractable, without status migrainosus: Secondary | ICD-10-CM | POA: Diagnosis not present

## 2023-04-24 DIAGNOSIS — F3181 Bipolar II disorder: Secondary | ICD-10-CM | POA: Diagnosis not present

## 2023-04-29 ENCOUNTER — Other Ambulatory Visit: Payer: Self-pay

## 2023-05-14 ENCOUNTER — Other Ambulatory Visit (HOSPITAL_COMMUNITY): Payer: Self-pay

## 2023-05-19 DIAGNOSIS — G43719 Chronic migraine without aura, intractable, without status migrainosus: Secondary | ICD-10-CM | POA: Diagnosis not present

## 2023-05-19 DIAGNOSIS — G43709 Chronic migraine without aura, not intractable, without status migrainosus: Secondary | ICD-10-CM | POA: Diagnosis not present

## 2023-06-02 ENCOUNTER — Other Ambulatory Visit (HOSPITAL_COMMUNITY): Payer: Self-pay

## 2023-06-02 ENCOUNTER — Other Ambulatory Visit: Payer: Self-pay

## 2023-06-02 DIAGNOSIS — G43709 Chronic migraine without aura, not intractable, without status migrainosus: Secondary | ICD-10-CM | POA: Diagnosis not present

## 2023-06-02 MED ORDER — BOTOX 200 UNITS IJ SOLR: INTRAMUSCULAR | 0 refills | Status: DC

## 2023-06-03 ENCOUNTER — Other Ambulatory Visit: Payer: Self-pay

## 2023-06-03 MED ORDER — BOTOX 200 UNITS IJ SOLR
INTRAMUSCULAR | 3 refills | Status: AC
  Filled 2023-06-03: qty 1, 84d supply, fill #0

## 2023-06-03 NOTE — Progress Notes (Signed)
Encounter created in error - Patient recently transitioned care to new provider and does not need Botox refill at this time. Botox administered last 12.10.24 at [facility] and will not be due again until around 3.10.24

## 2023-06-04 ENCOUNTER — Other Ambulatory Visit: Payer: Self-pay

## 2023-06-04 NOTE — Progress Notes (Signed)
06/04/23: [facility] called 12/11 to schedule refill for Botox injection. Informed office that we will have to speak with patient. Patient informed us that she does not want the injection sent to their office. Possible transition of care and also received previous injection on 12/10 at [facility]). Left voicemail to [facility] that patient did not want injection there and to call her back with any further concerns.

## 2023-06-25 ENCOUNTER — Other Ambulatory Visit: Payer: Self-pay

## 2023-07-08 ENCOUNTER — Telehealth: Payer: Self-pay | Admitting: Physician Assistant

## 2023-07-08 NOTE — Telephone Encounter (Signed)
 Copied from CRM [id number]. Topic: Appointments - Scheduling Inquiry for Clinic >> Jul 08, 2023  3:52 PM Gwendolyn Bowen wrote: Reason for CRM: Patient is wanting to know if Gwendolyn Bowen can also be her daughter's new PCP, she would be a new patient

## 2023-08-07 ENCOUNTER — Other Ambulatory Visit (HOSPITAL_COMMUNITY): Payer: Self-pay

## 2023-08-07 ENCOUNTER — Other Ambulatory Visit (HOSPITAL_BASED_OUTPATIENT_CLINIC_OR_DEPARTMENT_OTHER): Payer: Self-pay

## 2023-08-07 DIAGNOSIS — G43709 Chronic migraine without aura, not intractable, without status migrainosus: Secondary | ICD-10-CM | POA: Diagnosis not present

## 2023-08-07 DIAGNOSIS — G43719 Chronic migraine without aura, intractable, without status migrainosus: Secondary | ICD-10-CM | POA: Diagnosis not present

## 2023-08-19 ENCOUNTER — Other Ambulatory Visit: Payer: Self-pay

## 2023-08-21 ENCOUNTER — Other Ambulatory Visit (HOSPITAL_BASED_OUTPATIENT_CLINIC_OR_DEPARTMENT_OTHER): Payer: Self-pay

## 2023-08-21 ENCOUNTER — Telehealth (INDEPENDENT_AMBULATORY_CARE_PROVIDER_SITE_OTHER): Payer: Commercial Managed Care - PPO | Admitting: Physician Assistant

## 2023-08-21 ENCOUNTER — Ambulatory Visit: Payer: Commercial Managed Care - PPO | Admitting: Physician Assistant

## 2023-08-21 ENCOUNTER — Other Ambulatory Visit: Payer: Self-pay

## 2023-08-21 DIAGNOSIS — F3181 Bipolar II disorder: Secondary | ICD-10-CM

## 2023-08-21 DIAGNOSIS — F5101 Primary insomnia: Secondary | ICD-10-CM | POA: Diagnosis not present

## 2023-08-21 DIAGNOSIS — F411 Generalized anxiety disorder: Secondary | ICD-10-CM

## 2023-08-21 DIAGNOSIS — F331 Major depressive disorder, recurrent, moderate: Secondary | ICD-10-CM

## 2023-08-21 MED ORDER — CARIPRAZINE HCL 3 MG PO CAPS
3.0000 mg | ORAL_CAPSULE | Freq: Every day | ORAL | 1 refills | Status: AC
  Filled 2023-08-21: qty 90, 90d supply, fill #0

## 2023-08-21 MED ORDER — BUSPIRONE HCL 5 MG PO TABS
5.0000 mg | ORAL_TABLET | Freq: Three times a day (TID) | ORAL | 1 refills | Status: AC
  Filled 2023-08-21: qty 90, 30d supply, fill #0

## 2023-08-21 MED ORDER — FLUOXETINE HCL 40 MG PO CAPS
40.0000 mg | ORAL_CAPSULE | Freq: Every day | ORAL | 3 refills | Status: AC
  Filled 2023-08-21: qty 90, 90d supply, fill #0
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
## 2023-08-24 ENCOUNTER — Other Ambulatory Visit (HOSPITAL_COMMUNITY): Payer: Self-pay

## 2023-08-24 ENCOUNTER — Encounter: Payer: Self-pay | Admitting: Physician Assistant

## 2023-09-01 ENCOUNTER — Encounter: Payer: Self-pay | Admitting: Sports Medicine

## 2023-09-01 ENCOUNTER — Other Ambulatory Visit: Payer: Self-pay | Admitting: Sports Medicine

## 2023-09-01 ENCOUNTER — Ambulatory Visit

## 2023-09-01 ENCOUNTER — Ambulatory Visit (INDEPENDENT_AMBULATORY_CARE_PROVIDER_SITE_OTHER): Admitting: Sports Medicine

## 2023-09-01 DIAGNOSIS — G8929 Other chronic pain: Secondary | ICD-10-CM

## 2023-09-01 DIAGNOSIS — G43709 Chronic migraine without aura, not intractable, without status migrainosus: Secondary | ICD-10-CM | POA: Diagnosis not present

## 2023-09-01 DIAGNOSIS — M79671 Pain in right foot: Secondary | ICD-10-CM

## 2023-09-01 DIAGNOSIS — M79672 Pain in left foot: Secondary | ICD-10-CM | POA: Diagnosis not present

## 2023-09-01 NOTE — Progress Notes (Addendum)
    Procedures performed today:    None.  Independent interpretation of notes and tests performed by another provider:   None.  Brief History, Exam, Impression, and Recommendations:    Chronic foot pain, right 43 year old female, she does a lot of barefoot walking, increasing pain plantar aspect right foot from the plantar fascia to the metatarsal heads. On exam she has visible pes cavus. She has tenderness along the entire plantar fascia Suspect more of a midfoot strain, this does not resemble classic plantar fasciitis. We discussed the anatomy and pathophysiology, we will get x-rays, she will avoid barefoot walking. She needs custom molded orthotics, intrinsic foot muscle physical therapy. Return to see me in 4 weeks, MRI if not better.    ____________________________________________ Ihor Austin. Benjamin Stain, M.D., ABFM., CAQSM., AME. Primary Care and Sports Medicine [facility]  Adjunct Professor of Family Medicine  [employer]  Restaurant manager, fast food

## 2023-09-01 NOTE — Assessment & Plan Note (Addendum)
 43 year old female, she does a lot of barefoot walking, increasing pain plantar aspect right foot from the plantar fascia to the metatarsal heads. On exam she has visible pes cavus. She has tenderness along the entire plantar fascia Suspect more of a midfoot strain, this does not resemble classic plantar fasciitis. We discussed the anatomy and pathophysiology, we will get x-rays, she will avoid barefoot walking. She needs custom molded orthotics, intrinsic foot muscle physical therapy. Return to see me in 4 weeks, MRI if not better.

## 2023-09-03 ENCOUNTER — Other Ambulatory Visit (HOSPITAL_BASED_OUTPATIENT_CLINIC_OR_DEPARTMENT_OTHER): Payer: Self-pay

## 2023-09-08 ENCOUNTER — Ambulatory Visit: Attending: Sports Medicine

## 2023-09-11 ENCOUNTER — Telehealth: Payer: Self-pay

## 2023-09-11 ENCOUNTER — Ambulatory Visit: Admitting: Family Medicine

## 2023-09-11 NOTE — Telephone Encounter (Signed)
 Copied from CRM [id number]. Topic: Clinical - Request for Lab/Test Order >> Sep 11, 2023 10:06 AM Nila Nephew wrote: Reason for CRM: Patient is calling to request imaging order be placed for her left wrist. States she pushed up on her chair at work and it swelled up and is now being iced but hurts.  Patient is requesting a call today at her work desk number #[phone] - will be there for the next hour but can be reached on cell phone between 11:00 and 11:30 for lunch.

## 2023-09-11 NOTE — Telephone Encounter (Signed)
 I spoke with patient and advised her she will need an appointment. Offered appointment today with Dr Linford Arnold. She declined appointment. She will see how it feels after the weekend and call us back if needed.

## 2023-10-09 ENCOUNTER — Other Ambulatory Visit (HOSPITAL_BASED_OUTPATIENT_CLINIC_OR_DEPARTMENT_OTHER): Payer: Self-pay

## 2023-10-09 ENCOUNTER — Other Ambulatory Visit: Payer: Self-pay | Admitting: Physician Assistant

## 2023-10-09 DIAGNOSIS — G43109 Migraine with aura, not intractable, without status migrainosus: Secondary | ICD-10-CM

## 2023-10-09 NOTE — Telephone Encounter (Signed)
 Copied from CRM [id number]. Topic: Clinical - Medication Refill >> Oct 09, 2023 11:25 AM Arlie Benedict B wrote: Most Recent Primary Care Visit:  Provider: Gean Keels  Department: [facility]  Visit Type: ACUTE  Date: 09/01/2023  Medication: rizatriptan  (MAXALT ) 10 MG tablet  Has the patient contacted their pharmacy? Yes (Agent: If yes, when and what did the pharmacy advise?) patient called the pharmacy and they advised that they had sent over the refill request; however she didn't realize the office was closed. I advised her I would submit the refill request for her   Is this the correct pharmacy for this prescription? Yes If no, delete pharmacy and type the correct one.  This is the patient's preferred pharmacy:  [facility] [place] [facility] [address], [address] [place] [address] Phone: [phone] Fax: [phone]  [facility] [phone] - [address], [place] - [address] [place] [address] Phone: [phone] Fax: [phone]   Has the prescription been filled recently? Yes  Is the patient out of the medication? Yes  Has the patient been seen for an appointment in the last year OR does the patient have an upcoming appointment? Yes  Can we respond through MyChart? Yes  Agent: Please be advised that Rx refills may take up to 3 business days. We ask that you follow-up with your pharmacy.

## 2023-10-12 ENCOUNTER — Other Ambulatory Visit (HOSPITAL_BASED_OUTPATIENT_CLINIC_OR_DEPARTMENT_OTHER): Payer: Self-pay

## 2023-10-12 MED ORDER — RIZATRIPTAN BENZOATE 10 MG PO TABS
10.0000 mg | ORAL_TABLET | ORAL | 5 refills | Status: AC | PRN
  Filled 2023-10-12: qty 9, 10d supply, fill #0

## 2023-10-13 ENCOUNTER — Ambulatory Visit: Admitting: Sports Medicine

## 2023-11-04 ENCOUNTER — Other Ambulatory Visit: Payer: Self-pay | Admitting: Pharmacy Technician

## 2023-11-04 ENCOUNTER — Other Ambulatory Visit: Payer: Self-pay

## 2023-11-04 NOTE — Progress Notes (Signed)
 Disenrolled; Last filled 03/2023

## 2023-11-05 ENCOUNTER — Other Ambulatory Visit (HOSPITAL_COMMUNITY): Payer: Self-pay

## 2023-11-10 ENCOUNTER — Telehealth: Payer: Self-pay

## 2023-11-10 NOTE — Telephone Encounter (Signed)
 Copied from CRM [id number]. Topic: General - Other >> Nov 10, 2023  4:24 PM Kevelyn M wrote: Reason for CRM: Patient need to renew her FMLA. Please call patient if anything is needed. [phone]

## 2023-11-10 NOTE — Telephone Encounter (Signed)
 Eaisest way to do that is schedule virtual or in person appt and fill it out together so all information is correct. Please schedule.

## 2023-11-12 NOTE — Telephone Encounter (Signed)
 Patient scheduled.

## 2023-11-24 ENCOUNTER — Telehealth: Admitting: Physician Assistant

## 2023-11-24 VITALS — Ht 63.0 in | Wt 150.0 lb

## 2023-11-24 DIAGNOSIS — G43109 Migraine with aura, not intractable, without status migrainosus: Secondary | ICD-10-CM | POA: Diagnosis not present

## 2023-11-24 DIAGNOSIS — F411 Generalized anxiety disorder: Secondary | ICD-10-CM | POA: Diagnosis not present

## 2023-11-24 DIAGNOSIS — F5101 Primary insomnia: Secondary | ICD-10-CM | POA: Diagnosis not present

## 2023-11-24 DIAGNOSIS — G43719 Chronic migraine without aura, intractable, without status migrainosus: Secondary | ICD-10-CM | POA: Diagnosis not present

## 2023-11-24 DIAGNOSIS — F439 Reaction to severe stress, unspecified: Secondary | ICD-10-CM | POA: Diagnosis not present

## 2023-11-24 DIAGNOSIS — F331 Major depressive disorder, recurrent, moderate: Secondary | ICD-10-CM

## 2023-11-24 DIAGNOSIS — G43709 Chronic migraine without aura, not intractable, without status migrainosus: Secondary | ICD-10-CM | POA: Diagnosis not present

## 2023-11-24 DIAGNOSIS — F3181 Bipolar II disorder: Secondary | ICD-10-CM
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
## 2023-11-25 ENCOUNTER — Encounter: Payer: Self-pay | Admitting: Physician Assistant

## 2023-11-30 ENCOUNTER — Encounter: Payer: Self-pay | Admitting: Physician Assistant

## 2023-12-08 ENCOUNTER — Other Ambulatory Visit (HOSPITAL_COMMUNITY): Payer: Self-pay

## 2023-12-08 ENCOUNTER — Other Ambulatory Visit (HOSPITAL_BASED_OUTPATIENT_CLINIC_OR_DEPARTMENT_OTHER): Payer: Self-pay

## 2023-12-08 DIAGNOSIS — G43709 Chronic migraine without aura, not intractable, without status migrainosus: Secondary | ICD-10-CM | POA: Diagnosis not present

## 2023-12-08 MED ORDER — RIZATRIPTAN BENZOATE 10 MG PO TABS
10.0000 mg | ORAL_TABLET | ORAL | 3 refills | Status: DC | PRN
  Filled 2023-12-08 (×2): qty 9, 10d supply, fill #0
  Filled 2024-01-19 (×2): qty 9, 10d supply, fill #1
  Filled 2024-03-04 (×2): qty 9, 10d supply, fill #2
  Filled 2024-04-29 (×2): qty 9, 10d supply, fill #3

## 2023-12-11 ENCOUNTER — Other Ambulatory Visit (HOSPITAL_COMMUNITY): Payer: Self-pay

## 2023-12-11 ENCOUNTER — Other Ambulatory Visit: Payer: Self-pay

## 2023-12-11 MED ORDER — BOTOX 200 UNITS IJ SOLR
INTRAMUSCULAR | 3 refills | Status: AC
  Filled 2024-02-29: qty 1, 84d supply, fill #0
  Filled 2024-05-23: qty 1, 30d supply, fill #1
  Filled 2024-05-25: qty 1, 84d supply, fill #1

## 2023-12-11 NOTE — Progress Notes (Signed)
 Pt just received injection. Next dose due on 9/20

## 2023-12-11 NOTE — Progress Notes (Addendum)
 Pharmacy Patient Advocate Encounter  Insurance verification completed.   The patient is insured through [employer]   Ran test claim for Botox . Co-pay is $0. Patient has copay card.  This test claim was processed through [facility]- copay amounts may vary at other pharmacies due to pharmacy/plan contracts, or as the patient moves through the different stages of their insurance plan.

## 2023-12-12 ENCOUNTER — Other Ambulatory Visit (HOSPITAL_COMMUNITY): Payer: Self-pay

## 2023-12-16 ENCOUNTER — Other Ambulatory Visit (HOSPITAL_COMMUNITY): Payer: Self-pay

## 2023-12-23 ENCOUNTER — Other Ambulatory Visit (HOSPITAL_COMMUNITY): Payer: Self-pay

## 2024-01-01 ENCOUNTER — Other Ambulatory Visit (HOSPITAL_COMMUNITY): Payer: Self-pay

## 2024-01-04 DIAGNOSIS — Z01419 Encounter for gynecological examination (general) (routine) without abnormal findings: Secondary | ICD-10-CM | POA: Diagnosis not present

## 2024-01-04 DIAGNOSIS — N644 Mastodynia: Secondary | ICD-10-CM | POA: Diagnosis not present

## 2024-01-06 ENCOUNTER — Other Ambulatory Visit (HOSPITAL_COMMUNITY): Payer: Self-pay

## 2024-01-08 ENCOUNTER — Other Ambulatory Visit: Payer: Self-pay

## 2024-01-19 ENCOUNTER — Other Ambulatory Visit: Payer: Self-pay

## 2024-01-19 ENCOUNTER — Other Ambulatory Visit (HOSPITAL_COMMUNITY): Payer: Self-pay

## 2024-02-04 DIAGNOSIS — N644 Mastodynia: Secondary | ICD-10-CM | POA: Diagnosis not present

## 2024-02-04 DIAGNOSIS — R92323 Mammographic fibroglandular density, bilateral breasts: Secondary | ICD-10-CM | POA: Diagnosis not present

## 2024-02-19 ENCOUNTER — Ambulatory Visit: Admitting: Physician Assistant

## 2024-02-23 ENCOUNTER — Encounter: Payer: Self-pay | Admitting: Sports Medicine

## 2024-02-23 ENCOUNTER — Other Ambulatory Visit: Payer: Self-pay

## 2024-02-24 ENCOUNTER — Telehealth: Payer: Self-pay | Admitting: Physician Assistant

## 2024-02-24 ENCOUNTER — Ambulatory Visit: Admitting: Physician Assistant

## 2024-02-24 NOTE — Telephone Encounter (Signed)
 She can try flonase  OTC 2 sprays each nostril and debrox drops OTC for ear 10 drops twice a day.

## 2024-02-24 NOTE — Telephone Encounter (Signed)
 Patient advised.

## 2024-02-24 NOTE — Telephone Encounter (Signed)
 Copied from CRM #[id number]. Topic: General - Other >> Feb 24, 2024 10:01 AM Kevelyn M wrote: Reason for CRM: Patient can not come in because her job is going to give her a half point if she leaves early([employer]). She apologizes for canceling last minute. She is asking what should she do? Her ear is clogged.

## 2024-02-25 ENCOUNTER — Other Ambulatory Visit: Payer: Self-pay

## 2024-02-29 ENCOUNTER — Other Ambulatory Visit: Payer: Self-pay

## 2024-02-29 NOTE — Progress Notes (Signed)
 Specialty Pharmacy Initial Fill Coordination Note  Gwendolyn Bowen is a 43 y.o. female contacted today regarding initial fill of specialty medication(s) OnabotulinumtoxinA  (Botox )   Patient requested Delivery   Delivery date: 03/08/24   Verified address: [facility]   Medication will be filled on 9/15.   Patient is aware of $0 copayment.

## 2024-03-01 ENCOUNTER — Other Ambulatory Visit: Payer: Self-pay

## 2024-03-01 ENCOUNTER — Other Ambulatory Visit (HOSPITAL_COMMUNITY): Payer: Self-pay

## 2024-03-03 DIAGNOSIS — G43709 Chronic migraine without aura, not intractable, without status migrainosus: Secondary | ICD-10-CM | POA: Diagnosis not present

## 2024-03-03 DIAGNOSIS — G43719 Chronic migraine without aura, intractable, without status migrainosus: Secondary | ICD-10-CM | POA: Diagnosis not present

## 2024-03-04 ENCOUNTER — Other Ambulatory Visit (HOSPITAL_COMMUNITY): Payer: Self-pay

## 2024-03-07 ENCOUNTER — Other Ambulatory Visit: Payer: Self-pay

## 2024-03-10 DIAGNOSIS — G43709 Chronic migraine without aura, not intractable, without status migrainosus: Secondary | ICD-10-CM | POA: Diagnosis not present

## 2024-03-13 ENCOUNTER — Encounter: Payer: Self-pay | Admitting: Emergency Medicine

## 2024-03-13 ENCOUNTER — Ambulatory Visit
Admission: EM | Admit: 2024-03-13 | Discharge: 2024-03-13 | Disposition: A | Discharge status: Home / Self Care | Attending: Family Medicine | Admitting: Family Medicine

## 2024-03-13 ENCOUNTER — Other Ambulatory Visit: Payer: Self-pay

## 2024-03-13 DIAGNOSIS — J0100 Acute maxillary sinusitis, unspecified: Secondary | ICD-10-CM

## 2024-03-13 DIAGNOSIS — R0981 Nasal congestion: Secondary | ICD-10-CM

## 2024-03-13 MED ORDER — DOXYCYCLINE HYCLATE 100 MG PO CAPS: 100.0000 mg | ORAL_CAPSULE | Freq: Two times a day (BID) | ORAL | 0 refills | Status: AC

## 2024-03-13 MED ORDER — PREDNISONE 20 MG PO TABS: ORAL_TABLET | ORAL | 0 refills | Status: AC

## 2024-03-13 NOTE — Discharge Instructions (Addendum)
 Advised patient to take medications as directed with food to completion.  Advised patient to take prednisone  with first dose of doxycycline  for the next 5 of 10 days.  Encouraged increase daily water intake to 64 ounces per day while taking these medications.  Advised if symptoms worsen and/or unresolved please follow-up with your PCP or here for further evaluation.

## 2024-03-13 NOTE — ED Triage Notes (Signed)
 Patient c/o sinus pressure and pain, head congestion, bilateral ear pain x 10 days.  Afebrile.  Patient has taken Alka Seltzer Plus.
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
## 2024-04-14 ENCOUNTER — Other Ambulatory Visit: Payer: Self-pay

## 2024-04-15 ENCOUNTER — Other Ambulatory Visit (HOSPITAL_COMMUNITY): Payer: Self-pay

## 2024-04-15 MED ORDER — FLUZONE 0.5 ML IM SUSY
0.5000 mL | PREFILLED_SYRINGE | Freq: Once | INTRAMUSCULAR | 0 refills | Status: AC
  Filled 2024-04-15: qty 0.5, 1d supply, fill #0

## 2024-04-29 ENCOUNTER — Other Ambulatory Visit (HOSPITAL_COMMUNITY): Payer: Self-pay

## 2024-05-03 ENCOUNTER — Other Ambulatory Visit: Payer: Self-pay

## 2024-05-04 ENCOUNTER — Other Ambulatory Visit: Payer: Self-pay

## 2024-05-23 ENCOUNTER — Other Ambulatory Visit: Payer: Self-pay

## 2024-05-23 ENCOUNTER — Telehealth: Payer: Self-pay

## 2024-05-23 NOTE — Telephone Encounter (Signed)
 Pharmacy Patient Advocate Encounter   Received notification from Pt Calls Messages that prior authorization for Botox  is required/requested.   Insurance verification completed.   The patient is insured through [facility].   Per test claim: PA required; PA submitted to above mentioned insurance via Latent Key/confirmation #/EOC [id number] Status is pending

## 2024-05-24 ENCOUNTER — Other Ambulatory Visit (HOSPITAL_COMMUNITY): Payer: Self-pay

## 2024-05-24 ENCOUNTER — Other Ambulatory Visit: Payer: Self-pay

## 2024-05-24 NOTE — Telephone Encounter (Signed)
 Pharmacy Patient Advocate Encounter  Received notification from [facility] that Prior Authorization for Botox  has been APPROVED from 05/23/24 to 05/22/25   PA #/Case ID/Reference #: [id number]

## 2024-05-25 ENCOUNTER — Other Ambulatory Visit: Payer: Self-pay

## 2024-05-26 ENCOUNTER — Other Ambulatory Visit: Payer: Self-pay

## 2024-06-06 ENCOUNTER — Other Ambulatory Visit: Payer: Self-pay

## 2024-06-06 NOTE — Progress Notes (Signed)
 Specialty Pharmacy Refill Coordination Note  Gwendolyn Bowen is a 43 y.o. female contacted today regarding refills of specialty medication(s) OnabotulinumtoxinA  (Botox )   Patient requested Delivery   Delivery date: 06/08/24   Verified address: [facility]   Medication will be filled on: 06/09/24

## 2024-06-09 DIAGNOSIS — G43709 Chronic migraine without aura, not intractable, without status migrainosus: Secondary | ICD-10-CM | POA: Diagnosis not present

## 2024-06-13 ENCOUNTER — Other Ambulatory Visit (HOSPITAL_COMMUNITY): Payer: Self-pay

## 2024-06-13 MED ORDER — RIZATRIPTAN BENZOATE 10 MG PO TABS
10.0000 mg | ORAL_TABLET | Freq: Every day | ORAL | 3 refills | Status: AC
  Filled 2024-06-13: qty 9, 10d supply, fill #0
  Filled 2024-07-12: qty 9, 10d supply, fill #1

## 2024-06-14 ENCOUNTER — Other Ambulatory Visit (HOSPITAL_COMMUNITY): Payer: Self-pay

## 2024-07-12 ENCOUNTER — Other Ambulatory Visit (HOSPITAL_COMMUNITY): Payer: Self-pay
